# Patient Record
Sex: Female | Born: 1980 | Hispanic: No | Marital: Married | Smoking: Never smoker
Health system: Southern US, Community
[De-identification: ages and names within clinical notes are randomized; demographics above are authoritative.]

## PROBLEM LIST (undated history)

## (undated) DIAGNOSIS — Z789 Other specified health status: Secondary | ICD-10-CM

## (undated) HISTORY — PX: NO PAST SURGERIES: SHX2092

---

## 2014-06-14 LAB — OB RESULTS CONSOLE RUBELLA ANTIBODY, IGM: RUBELLA: IMMUNE

## 2014-06-14 LAB — OB RESULTS CONSOLE HIV ANTIBODY (ROUTINE TESTING): HIV: NONREACTIVE

## 2014-06-14 LAB — OB RESULTS CONSOLE ABO/RH: RH Type: POSITIVE

## 2014-06-14 LAB — OB RESULTS CONSOLE ANTIBODY SCREEN: Antibody Screen: NEGATIVE

## 2014-06-14 LAB — OB RESULTS CONSOLE HEPATITIS B SURFACE ANTIGEN: Hepatitis B Surface Ag: NEGATIVE

## 2014-06-14 LAB — OB RESULTS CONSOLE RPR: RPR: NONREACTIVE

## 2014-06-24 ENCOUNTER — Other Ambulatory Visit (HOSPITAL_COMMUNITY)
Admission: RE | Admit: 2014-06-24 | Discharge: 2014-06-24 | Disposition: A | Discharge status: Home / Self Care | Payer: BC Managed Care – PPO | Source: Ambulatory Visit | Attending: Obstetrics & Gynecology | Admitting: Obstetrics & Gynecology

## 2014-06-24 DIAGNOSIS — Z01419 Encounter for gynecological examination (general) (routine) without abnormal findings: Secondary | ICD-10-CM | POA: Insufficient documentation

## 2014-06-24 DIAGNOSIS — Z113 Encounter for screening for infections with a predominantly sexual mode of transmission: Secondary | ICD-10-CM | POA: Insufficient documentation

## 2014-06-24 DIAGNOSIS — Z1151 Encounter for screening for human papillomavirus (HPV): Secondary | ICD-10-CM | POA: Insufficient documentation

## 2014-06-24 LAB — OB RESULTS CONSOLE GC/CHLAMYDIA
CHLAMYDIA, DNA PROBE: NEGATIVE
GC PROBE AMP, GENITAL: NEGATIVE

## 2014-10-01 NOTE — L&D Delivery Note (Signed)
Delivery Note At 7:35 AM a viable female was delivered via Vaginal, Spontaneous Delivery (Presentation: Right Occiput Anterior).  APGAR: 9, 9; weight 7 lb 15 oz (3600 g).   Placenta status: Intact, Spontaneous.  Cord: 3 vessels with the following complications: None.    Anesthesia: local Episiotomy: None Lacerations: 1st degree Suture Repair: 3.0 vicryl Est. Blood Loss (mL): 350  Mom to postpartum.  Baby to Couplet care / Skin to Skin.  Wanda Peterson, Wanda Peterson, M 12/14/2014, 10:03 AM

## 2014-12-14 ENCOUNTER — Encounter (HOSPITAL_COMMUNITY): Payer: Self-pay | Admitting: *Deleted

## 2014-12-14 ENCOUNTER — Inpatient Hospital Stay (HOSPITAL_COMMUNITY)
Admission: AD | Admit: 2014-12-14 | Discharge: 2014-12-15 | DRG: 775 | Disposition: A | Discharge status: Home / Self Care | Payer: 59 | Source: Ambulatory Visit | Attending: Obstetrics & Gynecology | Admitting: Obstetrics & Gynecology

## 2014-12-14 DIAGNOSIS — O09529 Supervision of elderly multigravida, unspecified trimester: Secondary | ICD-10-CM

## 2014-12-14 DIAGNOSIS — Z3A38 38 weeks gestation of pregnancy: Secondary | ICD-10-CM | POA: Diagnosis present

## 2014-12-14 DIAGNOSIS — Z789 Other specified health status: Secondary | ICD-10-CM

## 2014-12-14 DIAGNOSIS — Z641 Problems related to multiparity: Secondary | ICD-10-CM

## 2014-12-14 HISTORY — DX: Other specified health status: Z78.9

## 2014-12-14 LAB — CBC
HCT: 39.1 % (ref 36.0–46.0)
Hemoglobin: 13 g/dL (ref 12.0–15.0)
MCH: 29 pg (ref 26.0–34.0)
MCHC: 33.2 g/dL (ref 30.0–36.0)
MCV: 87.1 fL (ref 78.0–100.0)
Platelets: 247 10*3/uL (ref 150–400)
RBC: 4.49 MIL/uL (ref 3.87–5.11)
RDW: 14.7 % (ref 11.5–15.5)
WBC: 11.2 10*3/uL — ABNORMAL HIGH (ref 4.0–10.5)

## 2014-12-14 LAB — RPR: RPR Ser Ql: NONREACTIVE

## 2014-12-14 LAB — TYPE AND SCREEN
ABO/RH(D): B POS
ANTIBODY SCREEN: NEGATIVE

## 2014-12-14 LAB — OB RESULTS CONSOLE GBS: STREP GROUP B AG: NEGATIVE

## 2014-12-14 LAB — ABO/RH: ABO/RH(D): B POS

## 2014-12-14 LAB — GROUP B STREP BY PCR: Group B strep by PCR: NEGATIVE

## 2014-12-14 LAB — POCT FERN TEST: POCT FERN TEST: NEGATIVE

## 2014-12-14 MED ORDER — OXYTOCIN 40 UNITS IN LACTATED RINGERS INFUSION - SIMPLE MED
62.5000 mL/h | INTRAVENOUS | Status: DC
  Filled 2014-12-14: qty 1000

## 2014-12-14 MED ORDER — SIMETHICONE 80 MG PO CHEW: 80.0000 mg | CHEWABLE_TABLET | ORAL | Status: DC | PRN

## 2014-12-14 MED ORDER — ONDANSETRON HCL 4 MG/2ML IJ SOLN: 4.0000 mg | INTRAMUSCULAR | Status: DC | PRN

## 2014-12-14 MED ORDER — DIPHENHYDRAMINE HCL 25 MG PO CAPS: 25.0000 mg | ORAL_CAPSULE | Freq: Four times a day (QID) | ORAL | Status: DC | PRN

## 2014-12-14 MED ORDER — SENNOSIDES-DOCUSATE SODIUM 8.6-50 MG PO TABS
2.0000 | ORAL_TABLET | ORAL | Status: DC
  Administered 2014-12-14: 2 via ORAL
  Filled 2014-12-14: qty 2

## 2014-12-14 MED ORDER — IBUPROFEN 600 MG PO TABS
600.0000 mg | ORAL_TABLET | Freq: Four times a day (QID) | ORAL | Status: DC
  Administered 2014-12-14 – 2014-12-15 (×5): 600 mg via ORAL
  Filled 2014-12-14 (×5): qty 1

## 2014-12-14 MED ORDER — LACTATED RINGERS IV SOLN: 500.0000 mL | INTRAVENOUS | Status: DC | PRN

## 2014-12-14 MED ORDER — ZOLPIDEM TARTRATE 5 MG PO TABS: 5.0000 mg | ORAL_TABLET | Freq: Every evening | ORAL | Status: DC | PRN

## 2014-12-14 MED ORDER — WITCH HAZEL-GLYCERIN EX PADS: 1.0000 "application " | MEDICATED_PAD | CUTANEOUS | Status: DC | PRN

## 2014-12-14 MED ORDER — TERBUTALINE SULFATE 1 MG/ML IJ SOLN
0.2500 mg | Freq: Once | INTRAMUSCULAR | Status: DC | PRN
  Filled 2014-12-14: qty 1

## 2014-12-14 MED ORDER — BENZOCAINE-MENTHOL 20-0.5 % EX AERO
1.0000 "application " | INHALATION_SPRAY | CUTANEOUS | Status: DC | PRN
  Administered 2014-12-15: 1 via TOPICAL
  Filled 2014-12-14 (×2): qty 56

## 2014-12-14 MED ORDER — NALBUPHINE HCL 10 MG/ML IJ SOLN
10.0000 mg | INTRAMUSCULAR | Status: DC | PRN
  Administered 2014-12-14 (×2): 10 mg via INTRAVENOUS
  Filled 2014-12-14: qty 1

## 2014-12-14 MED ORDER — OXYTOCIN 40 UNITS IN LACTATED RINGERS INFUSION - SIMPLE MED
1.0000 m[IU]/min | INTRAVENOUS | Status: DC
  Administered 2014-12-14: 1 m[IU]/min via INTRAVENOUS

## 2014-12-14 MED ORDER — DIBUCAINE 1 % RE OINT: 1.0000 "application " | TOPICAL_OINTMENT | RECTAL | Status: DC | PRN

## 2014-12-14 MED ORDER — OXYTOCIN BOLUS FROM INFUSION: 500.0000 mL | INTRAVENOUS | Status: DC

## 2014-12-14 MED ORDER — OXYCODONE-ACETAMINOPHEN 5-325 MG PO TABS: 2.0000 | ORAL_TABLET | ORAL | Status: DC | PRN

## 2014-12-14 MED ORDER — NALBUPHINE HCL 10 MG/ML IJ SOLN
5.0000 mg | INTRAMUSCULAR | Status: DC | PRN
  Filled 2014-12-14: qty 1

## 2014-12-14 MED ORDER — CITRIC ACID-SODIUM CITRATE 334-500 MG/5ML PO SOLN: 30.0000 mL | ORAL | Status: DC | PRN

## 2014-12-14 MED ORDER — PRENATAL MULTIVITAMIN CH
1.0000 | ORAL_TABLET | Freq: Every day | ORAL | Status: DC
  Administered 2014-12-15: 1 via ORAL
  Filled 2014-12-14: qty 1

## 2014-12-14 MED ORDER — LACTATED RINGERS IV SOLN
INTRAVENOUS | Status: DC
  Administered 2014-12-14: 125 mL/h via INTRAVENOUS

## 2014-12-14 MED ORDER — FLEET ENEMA 7-19 GM/118ML RE ENEM: 1.0000 | ENEMA | RECTAL | Status: DC | PRN

## 2014-12-14 MED ORDER — ONDANSETRON HCL 4 MG PO TABS: 4.0000 mg | ORAL_TABLET | ORAL | Status: DC | PRN

## 2014-12-14 MED ORDER — LIDOCAINE HCL (PF) 1 % IJ SOLN
30.0000 mL | INTRAMUSCULAR | Status: DC | PRN
  Administered 2014-12-14: 30 mL via SUBCUTANEOUS
  Filled 2014-12-14: qty 30

## 2014-12-14 MED ORDER — ACETAMINOPHEN 325 MG PO TABS: 650.0000 mg | ORAL_TABLET | ORAL | Status: DC | PRN

## 2014-12-14 MED ORDER — ONDANSETRON HCL 4 MG/2ML IJ SOLN: 4.0000 mg | Freq: Four times a day (QID) | INTRAMUSCULAR | Status: DC | PRN

## 2014-12-14 MED ORDER — OXYCODONE-ACETAMINOPHEN 5-325 MG PO TABS: 1.0000 | ORAL_TABLET | ORAL | Status: DC | PRN

## 2014-12-14 MED ORDER — LANOLIN HYDROUS EX OINT: TOPICAL_OINTMENT | CUTANEOUS | Status: DC | PRN

## 2014-12-14 MED ORDER — MISOPROSTOL 200 MCG PO TABS
ORAL_TABLET | ORAL | Status: AC
  Filled 2014-12-14: qty 5

## 2014-12-14 NOTE — Lactation Note (Signed)
This note was copied from the chart of Wanda Peterson. Lactation Consultation Note  Patient Name: Wanda Linna Darnerzza Madore GMWNU'UToday's Date: 12/14/2014 Reason for consult: Initial assessment of this mom and baby at 15 hours pp. This is mom's sixth child.  She now has 2 daughters and 4 sons, ages from 563 yo up to 36 yo.  Mom says she breastfed her other children for 2-3 months each and has chosen to both breast and formula feed, as she did with her other babies.  Her newborn is asleep on his back, in open crib and no cuing at time of LC visit.  LC reviewed LEAD cautions and encouraged frequent STS and cue feedings at breast. Mom says she knows how to hand express her milk.  LC reviewed supply and demand and nature of mom's rich colostrum.  FOB asks about the difference between formula and mother's milk and LC reviewed immunologic benefits and ease of digestion, with exclusive breastfeeding and breast milk as the ideal food for a newborn.   Mom encouraged to feed baby 8-12 times/24 hours and with feeding cues. LC encouraged review of Baby and Me pp 9, 14 and 20-25 for STS and BF information. LC provided [employer] Resource brochure and reviewed [facility] services and list of community and web site resources.   Maternal Data Formula Feeding for Exclusion: No (mom did not state her plan to also bottle-feed until after delivery) Has patient been taught Hand Expression?: Yes (per RN and mom confirms knowing how to hand express) Does the patient have breastfeeding experience prior to this delivery?: Yes  Feeding Feeding Type: Breast Fed Length of feed: 12 min  LATCH Score/Interventions Latch: Repeated attempts needed to sustain latch, nipple held in mouth throughout feeding, stimulation needed to elicit sucking reflex. Intervention(s): Adjust position;Assist with latch;Breast compression  Audible Swallowing: A few with stimulation Intervention(s): Hand expression  Type of Nipple: Everted at rest and after stimulation  Comfort  (Breast/Nipple): Soft / non-tender     Hold (Positioning): Assistance needed to correctly position infant at breast and maintain latch. Intervention(s): Breastfeeding basics reviewed;Support Pillows;Position options  LATCH Score: 7  Lactation Tools Discussed/Used   STS, cue feedings, hand expression Benefits of breast milk  Consult Status Consult Status: Follow-up Date: 12/15/14 Follow-up type: In-patient    Warrick ParisianBryant, Karlis Cregg [facility] 12/14/2014, 10:55 PM

## 2014-12-14 NOTE — MAU Note (Signed)
BROUGHT  FROM LOBBY  TO RM  7 -  SAYS  NOT SROM   VE 1 CM IN OFFICE -

## 2014-12-14 NOTE — Progress Notes (Signed)
OB PN:  S: Pt feeling painful contractions.  O: BP 140/81 mmHg  Pulse 82  Temp(Src) 98.2 F (36.8 C) (Oral)  Resp 20  Ht 5' 4.96" (1.65 m)  Wt 83 kg (182 lb 15.7 oz)  BMI 30.49 kg/m2  LMP 03/23/2014  Last BP: 129/86  FHT: 125bpm, moderate variablity, + accels, no decels Toco: q3-704min SVE: 9/C/-1  A/P: 34 y.o. [id number] @ 3558w0d who presented in active labor 1. FWB: Cat. I 2. Labor: Pit per protocol due to inadequate contractions Pain: IV pain medication as needed GBS: negative  Myna HidalgoJennifer Vladimir Lenhoff, DO [phone] (pager) [phone] (office)

## 2014-12-14 NOTE — H&P (Signed)
Wanda Peterson is a 34 y.o. female, pt of Dr. Charlotta Newtonzan, [id number] at 38.0 wks based on certain LMP, confirmed by ultrasound with EDD 12/28/2014 presents with regular contractions. SROM (clear fluid) while in MAU. Pregnancy overall uncomplicated. Reports active fetus. Denies VB.  Anatomy scan at 20 wks revealed anterior placenta with normal findings.   Maternal Medical History:  Reason for admission: Rupture of membranes and contractions.   Contractions: Onset was less than 1 hour ago.   Frequency: regular.   Duration is approximately 60 seconds.   Perceived severity is moderate.    Fetal activity: Perceived fetal activity is normal.   Last perceived fetal movement was within the past hour.    Prenatal complications: No bleeding.   Prenatal Complications - Diabetes: none.    OB History    Gravida Para Term Preterm AB TAB SAB Ectopic Multiple Living   6 5 5       5      SVD 01/2003 @ 40 wks, 2-hr labor, female infant, birthwt  6lbs SVD 03/2004 @ 40 wks, 3-hr labor, female infant, birthwt 7 lbs SVD 11/2006 @ 40 wks, 4-hr labor, female infant, birthwt 6 lbs SVD 08/2008 @ 40 wks, 1 1/2 hr labor, female infant, birthwt 7 lbs SVD 01/2012 @ 40 wks, 6-hr labor, female infant, birthwt 6 lbs  All deliveries in [place]   Past Medical History  Diagnosis Date  . Medical history non-contributory   Small umbilical hernia  Past Surgical History  Procedure Laterality Date  . No past surgeries     Family History: family history is not on file.Heart disease and DM in her father (has pacemaker), HTN in her mother and Down Syndrome in her niece. FOB w/ family hx of Thalassemia and Down Syndrome in two nieces. Social History:  reports that she has never smoked. She does not have any smokeless tobacco history on file. She reports that she does not drink alcohol or use illicit drugs.Pt is married to Northwest AirlinesMotaz. [place] moved here 2 1/2 yrs ago. Received Tdap on 09/10/2014. Also received flu  shot.   Prenatal Transfer Tool  Maternal Diabetes: No Genetic Screening: Normal Maternal Ultrasounds/Referrals: Normal Fetal Ultrasounds or other Referrals:  Referred to Materal Fetal Medicine -[facility] Maternal Substance Abuse:  No Significant Maternal Medications:  Meds include: Other: PNV Significant Maternal Lab Results:  Lab values include: Group B Strep negative Other Comments:  Neg GC/CT/pap on 06/24/14, +yeast (treated) and normal TSH on 06/24/14. Neg urine culture on 9/14.  Review of Systems  Constitutional: Negative for fever and chills.    Dilation: 4 Effacement (%): 100 Station: -2 Blood pressure 112/70, pulse 78, temperature 98.2 F (36.8 C), temperature source Oral, resp. rate 18, height 5' 4.96" (1.65 m), weight 182 lb 15.7 oz (83 kg), last menstrual period 03/23/2014. Maternal Exam:  Uterine Assessment: Contraction strength is moderate.  Contraction duration is 90 seconds. Contraction frequency is irregular.   Abdomen: Patient reports no abdominal tenderness. Fundal height is 38 cm.   Estimated fetal weight is 7 lbs.   Fetal presentation: vertex  Introitus: Normal vulva. Normal vagina.  Ferning test: positive.  Nitrazine test: not done. Amniotic fluid character: clear.  Pelvis: adequate for delivery.   Cervix: Cervix evaluated by digital exam.     Fetal Exam Fetal Monitor Review: Mode: fetoscope.   Baseline rate: 135.  Variability: moderate (6-25 bpm).   Pattern: accelerations present and no decelerations.    Fetal State Assessment: Category I - tracings  are normal.     Physical Exam  Nursing note and vitals reviewed. Constitutional: She is oriented to person, place, and time. She appears well-developed and well-nourished.  HENT:  Head: Normocephalic and atraumatic.  Neck: Normal range of motion.  Cardiovascular: Normal rate, regular rhythm and normal heart sounds.   Respiratory: Effort normal and breath sounds normal.  GI: Soft. She exhibits no  distension. There is no rebound and no guarding.  Genitourinary: Vagina normal.  Musculoskeletal: Normal range of motion. She exhibits no edema.  Neurological: She is alert and oriented to person, place, and time. She has normal reflexes.  Skin: Skin is warm and dry.  Psychiatric: She has a normal mood and affect. Her behavior is normal.    Prenatal labs: ABO, Rh: B/Positive/-- (09/14 0000) Antibody: Negative (09/14 0000) Rubella: Immune (09/14 0000) RPR: Nonreactive (09/14 0000)  HBsAg: Negative (09/14 0000)  HIV: Non-reactive (09/14 0000)  GBS: Unknown (neg by pt report)  Assessment: IUP at 38.0 wks Early labor SROM (0045 in MAU) GBS unknown Language barrier History of rapid labors Grand multip AMA  Plan: Admit to BS Routine orders GBS by PCR IV pain med/epidural as desired Anticipate progress and SVD   Sherre Scarlet 12/14/2014, 1:30 AM

## 2014-12-15 LAB — CBC
HCT: 35.6 % — ABNORMAL LOW (ref 36.0–46.0)
Hemoglobin: 11.5 g/dL — ABNORMAL LOW (ref 12.0–15.0)
MCH: 28.3 pg (ref 26.0–34.0)
MCHC: 32.3 g/dL (ref 30.0–36.0)
MCV: 87.5 fL (ref 78.0–100.0)
Platelets: 175 10*3/uL (ref 150–400)
RBC: 4.07 MIL/uL (ref 3.87–5.11)
RDW: 14.7 % (ref 11.5–15.5)
WBC: 13.4 10*3/uL — ABNORMAL HIGH (ref 4.0–10.5)

## 2014-12-15 MED ORDER — IBUPROFEN 600 MG PO TABS: 600.0000 mg | ORAL_TABLET | Freq: Four times a day (QID) | ORAL | Status: DC | PRN

## 2014-12-15 NOTE — Progress Notes (Signed)
Postpartum Note Day # 1  S:  Patient resting comfortable in bed.  Pain controlled.  Tolerating general diet. + BM.  Lochia moderate.  Ambulating without difficulty.  She denies n/v/f/c, SOB, or CP.  Pt plans on breastfeeding.  O: Temp:  [97.6 F (36.4 C)-98.3 F (36.8 C)] 98 F (36.7 C) (03/16 0552) Pulse Rate:  [66-91] 66 (03/16 0552) Resp:  [16-18] 16 (03/16 0552) BP: (102-134)/(34-106) 103/67 mmHg (03/16 0552) SpO2:  [99 %] 99 % (03/15 2315) Gen: A&Ox3, NAD CV: RRR, no MRG Resp: CTAB Abdomen: soft, NT, ND Uterus: firm, non-tender, below umbilicus Ext: No edema, no calf tenderness bilaterally  Labs:  Recent Labs  12/14/14 0130 12/15/14 0545  HGB 13.0 11.5*    A/P: Pt is a 34 y.o. [id number] s/p NSVD, PPD#1  - Pain well controlled -GU: voiding freely -GI: Tolerating general diet -Activity: encouraged sitting up to chair and ambulation as tolerated -Prophylaxis: early ambulation -Labs: stable as above -Baby boy circ to be done this am  DISPO: Meeting postpartum milestones appropriately, plan for discharge home later today.  Myna HidalgoJennifer Kelaiah Escalona, DO [phone] (pager) [phone] (office)

## 2014-12-15 NOTE — Discharge Instructions (Signed)
Vaginal Delivery, Care After Refer to this sheet in the next few weeks. These discharge instructions provide you with information on caring for yourself after delivery. Your caregiver may also give you specific instructions. Your treatment has been planned according to the most current medical practices available, but problems sometimes occur. Call your caregiver if you have any problems or questions after you go home. HOME CARE INSTRUCTIONS  Take over-the-counter or prescription medicines only as directed by your caregiver or pharmacist.  You may take either Motrin (Ibuprofen) or Tylenol as needed for pain.  Do not drink alcohol, especially if you are breastfeeding or taking medicine to relieve pain.  Do not chew or smoke tobacco.  Do not use illegal drugs.  Continue to use good perineal care. Good perineal care includes:  Wiping your perineum from front to back.  Keeping your perineum clean.  Do not use tampons or douche until your caregiver says it is okay.  Shower, wash your hair, and take tub baths as directed by your caregiver.  Wear a well-fitting bra that provides breast support.  Eat healthy foods.  Drink enough fluids to keep your urine clear or pale yellow.  Eat high-fiber foods such as whole grain cereals and breads, brown rice, beans, and fresh fruits and vegetables every day. These foods may help prevent or relieve constipation.  Follow your caregiver's recommendations regarding resumption of activities such as climbing stairs, driving, lifting, exercising, or traveling.  Talk to your caregiver about resuming sexual activities. Resumption of sexual activities is dependent upon your risk of infection, your rate of healing, and your comfort and desire to resume sexual activity.  Try to have someone help you with your household activities and your newborn for at least a few days after you leave the hospital.  Rest as much as possible. Try to rest or take a nap when your  newborn is sleeping.  Increase your activities gradually.  Keep all of your scheduled postpartum appointments. It is very important to keep your scheduled follow-up appointments. At these appointments, your caregiver will be checking to make sure that you are healing physically and emotionally. SEEK MEDICAL CARE IF:   You are passing large clots from your vagina. Save any clots to show your caregiver.  You have a foul smelling discharge from your vagina.  You have trouble urinating.  You are urinating frequently.  You have pain when you urinate.  You have a change in your bowel movements.  You have increasing redness, pain, or swelling near your vaginal incision (episiotomy) or vaginal tear.  You have pus draining from your episiotomy or vaginal tear.  Your episiotomy or vaginal tear is separating.  You have painful, hard, or reddened breasts.  You have a severe headache.  You have blurred vision or see spots.  You feel sad or depressed.  You have thoughts of hurting yourself or your newborn.  You have questions about your care, the care of your newborn, or medicines.  You are dizzy or light-headed.  You have a rash.  You have nausea or vomiting.  You were breastfeeding and have not had a menstrual period within 12 weeks after you stopped breastfeeding.  You are not breastfeeding and have not had a menstrual period by the 12th week after delivery.  You have a fever. SEEK IMMEDIATE MEDICAL CARE IF:   You have persistent pain.  You have chest pain.  You have shortness of breath.  You faint.  You have leg pain.  You  have stomach pain.  Your vaginal bleeding saturates two or more sanitary pads in 1 hour. MAKE SURE YOU:   Understand these instructions.  Will watch your condition.  Will get help right away if you are not doing well or get worse. Document Released: 09/14/2000 Document Revised: 02/01/2014 Document Reviewed: 05/14/2012 [facility] Patient  Information 2015 [place], [place]. This information is not intended to replace advice given to you by your health care provider. Make sure you discuss any questions you have with your health care provider.

## 2014-12-15 NOTE — Lactation Note (Signed)
This note was copied from the chart of Wanda Peterson. Lactation Consultation Note  Offered interpreter and mother stated she understood english and did not need translation. Mother denies problems or soreness w/ breastfeeding. Mother states she gave formula when she was really tired.  Reviewed supply and demand. Reviewed engorgement care and Baby & Me booklet pg 24 to monitor voids/stools. Encouraged her to call if she needs further assistance.  Patient Name: Wanda Linna Darnerzza Rawl WUJWJ'XToday's Date: 12/15/2014 Reason for consult: Follow-up assessment   Maternal Data    Feeding    LATCH Score/Interventions                      Lactation Tools Discussed/Used     Consult Status Consult Status: Complete    Hardie PulleyBerkelhammer, Leviathan Macera Boschen 12/15/2014, 11:45 AM

## 2014-12-17 NOTE — Discharge Summary (Signed)
Obstetric Discharge Summary Reason for Admission: onset of labor Prenatal Procedures: none Intrapartum Procedures: spontaneous vaginal delivery Postpartum Procedures: none Complications-Operative and Postpartum: 1st degree perineal laceration  Date of Admission: 12/14/14 Date of Discharge: 12/15/14 HEMOGLOBIN  Date Value Ref Range Status  12/15/2014 11.5* 12.0 - 15.0 g/dL Final   HCT  Date Value Ref Range Status  12/15/2014 35.6* 36.0 - 46.0 % Final   Hospital Course: 34 y.o. female, pt of Dr. Charlotta Newtonzan, [id number] at 38.0 wks based on certain LMP, confirmed by ultrasound with EDD 12/28/2014 presents with regular contractions. SROM (clear fluid) while in MAU. On arrival, the patient was noted to be 4cm dilated.  Due to slow progression, Pitocin was used for augmentation.  She progressed to complete.  NSVD performed by Dr. Charlotta Newtonzan without complications.  1st degree tear repaired in the usual fashion.  See delivery note for further information.  Her postpartum course was uncomplicated and she was discharged home in stable condition on POD#1.  Physical Exam:  Gen: A&Ox3, NAD CV: RRR, no MRG Resp: CTAB Abdomen: soft, NT, ND Uterus: firm, non-tender, below umbilicus Ext: No edema, no calf tenderness bilaterally  Discharge Diagnoses: Term Pregnancy-delivered  Discharge Information: Date: 12/17/2014 Activity: pelvic rest Diet: routine Medications: PNV and Ibuprofen Condition: stable Instructions: refer to practice specific booklet Discharge to: home Follow-up Information    Follow up with Myna HidalgoZAN, Raedyn Wenke, M, DO In 6 weeks.   Specialty:  Obstetrics and Gynecology   Contact information:   [address] [place] [id number] [phone]       Newborn Data: Live born female  Birth Weight: 7 lb 15 oz (3600 g) APGAR: 9, 9  Home with mother.  Myna HidalgoZAN, Christipher Rieger, M 12/17/2014, 8:13 AM

## 2016-01-19 DIAGNOSIS — M79642 Pain in left hand: Secondary | ICD-10-CM | POA: Diagnosis not present

## 2016-11-29 DIAGNOSIS — Z0000 Encounter for general adult medical examination without abnormal findings: Secondary | ICD-10-CM | POA: Diagnosis not present

## 2016-11-29 DIAGNOSIS — R0609 Other forms of dyspnea: Secondary | ICD-10-CM | POA: Diagnosis not present

## 2016-11-29 DIAGNOSIS — M791 Myalgia: Secondary | ICD-10-CM | POA: Diagnosis not present

## 2017-03-11 ENCOUNTER — Emergency Department (HOSPITAL_BASED_OUTPATIENT_CLINIC_OR_DEPARTMENT_OTHER)
Admission: EM | Admit: 2017-03-11 | Discharge: 2017-03-12 | Disposition: A | Discharge status: Home / Self Care | Payer: BLUE CROSS/BLUE SHIELD | Source: Home / Self Care | Attending: Emergency Medicine | Admitting: Emergency Medicine

## 2017-03-11 ENCOUNTER — Encounter (HOSPITAL_BASED_OUTPATIENT_CLINIC_OR_DEPARTMENT_OTHER): Payer: Self-pay

## 2017-03-11 DIAGNOSIS — N83201 Unspecified ovarian cyst, right side: Secondary | ICD-10-CM | POA: Diagnosis not present

## 2017-03-11 DIAGNOSIS — N83202 Unspecified ovarian cyst, left side: Secondary | ICD-10-CM | POA: Diagnosis not present

## 2017-03-11 DIAGNOSIS — N390 Urinary tract infection, site not specified: Secondary | ICD-10-CM

## 2017-03-11 DIAGNOSIS — R1084 Generalized abdominal pain: Secondary | ICD-10-CM | POA: Diagnosis not present

## 2017-03-11 DIAGNOSIS — N3000 Acute cystitis without hematuria: Secondary | ICD-10-CM | POA: Diagnosis not present

## 2017-03-11 DIAGNOSIS — Z79899 Other long term (current) drug therapy: Secondary | ICD-10-CM | POA: Diagnosis not present

## 2017-03-11 LAB — URINALYSIS, ROUTINE W REFLEX MICROSCOPIC
Bilirubin Urine: NEGATIVE
GLUCOSE, UA: NEGATIVE mg/dL
Ketones, ur: 15 mg/dL — AB
NITRITE: POSITIVE — AB
PH: 6.5 (ref 5.0–8.0)
Protein, ur: >300 mg/dL — AB
Specific Gravity, Urine: 1.021 (ref 1.005–1.030)

## 2017-03-11 LAB — URINALYSIS, MICROSCOPIC (REFLEX)

## 2017-03-11 LAB — PREGNANCY, URINE: Preg Test, Ur: NEGATIVE

## 2017-03-11 NOTE — ED Provider Notes (Signed)
[facility] Provider Note   CSN: [id number] Arrival date & time: 03/11/17  2207  By signing my name below, I, Diona Browner, attest that this documentation has been prepared under the direction and in the presence of Dornell Grasmick, PA-C. Electronically Signed: Diona Browner, ED Scribe. 03/11/17. 12:10 AM.  History   Chief Complaint Chief Complaint  Patient presents with  . Flank Pain    HPI Wanda Peterson is a 36 y.o. female who presents to the Emergency Department complaining of Dysuria and right-sided flank pain for the past 5 days. Associated sx include lower abdominal pain, urinary frequency, and hematuria. She has tried OTC urinary pain relief with mild to no relief. No sick contacts. No prior history of these symptoms. Pt denies fever, vaginal bleeding, LOC, nausea, vomiting, diarrhea, and appetite changes.  The history is provided by the patient. No language interpreter was used.   Past Medical History:  Diagnosis Date  . Medical history non-contributory     Patient Active Problem List   Diagnosis Date Noted  . Normal labor 12/14/2014  . Language barrier 12/14/2014  . Grand multiparity 12/14/2014  . Elderly multigravida 12/14/2014    Past Surgical History:  Procedure Laterality Date  . NO PAST SURGERIES      OB History    Gravida Para Term Preterm AB Living   6 6 6     1    SAB TAB Ectopic Multiple Live Births         0 1     Home Medications    Prior to Admission medications   Medication Sig Start Date End Date Taking? Authorizing Provider  phenazopyridine (PYRIDIUM) 97 MG tablet Take 97 mg by mouth 3 (three) times daily as needed for pain.   Yes [provider]  nitrofurantoin, macrocrystal-monohydrate, (MACROBID) 100 MG capsule Take 1 capsule (100 mg total) by mouth 2 (two) times daily. 03/12/17   Dietrich Pates, PA-C   Family History No family history on file.  Social History Social History  Substance Use Topics  . Smoking status:  Never Smoker  . Smokeless tobacco: Never Used  . Alcohol use No   Allergies   Patient has no known allergies.  Review of Systems Review of Systems  Constitutional: Negative for appetite change.  Gastrointestinal: Positive for abdominal pain. Negative for diarrhea, nausea and vomiting.  Genitourinary: Positive for dysuria, flank pain, frequency and hematuria. Negative for vaginal bleeding and vaginal discharge.  Musculoskeletal: Positive for back pain.  Neurological: Negative for syncope.  All other systems reviewed and are negative.  Physical Exam Updated Vital Signs BP 110/64 (BP Location: Left Arm)   Pulse 74   Temp 99.1 F (37.3 C) (Oral)   Resp 18   Wt 77.1 kg (170 lb)   LMP 02/18/2017   SpO2 100%   BMI 28.32 kg/m   Physical Exam  Constitutional: She appears well-developed and well-nourished. No distress.  HENT:  Head: Normocephalic and atraumatic.  Eyes: Conjunctivae and EOM are normal. No scleral icterus.  Neck: Normal range of motion.  Cardiovascular: Normal rate, regular rhythm and normal heart sounds.   Pulmonary/Chest: Effort normal and breath sounds normal. No respiratory distress.  Abdominal: Soft. Bowel sounds are normal. She exhibits no distension. There is tenderness. There is no rebound and no guarding.  Right CVA tenderness. Suprapubic tenderness.  Neurological: She is alert.  Skin: No rash noted. She is not diaphoretic.  Psychiatric: She has a normal mood and affect.  Nursing note and vitals  reviewed.  ED Treatments / Results  DIAGNOSTIC STUDIES: Oxygen Saturation is 100% on RA, normal by my interpretation.   COORDINATION OF CARE: 12:10 AM-Discussed next steps with pt which includes taking antibiotics. If pain worsens or changes she is to return to the ED for reevaluation. Pt verbalized understanding and is agreeable with the plan.   Labs (all labs ordered are listed, but only abnormal results are displayed) Labs Reviewed  URINALYSIS, ROUTINE  W REFLEX MICROSCOPIC - Abnormal; Notable for the following:       Result Value   Color, Urine ORANGE (*)    APPearance CLOUDY (*)    Hgb urine dipstick MODERATE (*)    Ketones, ur 15 (*)    Protein, ur >300 (*)    Nitrite POSITIVE (*)    Leukocytes, UA LARGE (*)    All other components within normal limits  URINALYSIS, MICROSCOPIC (REFLEX) - Abnormal; Notable for the following:    Bacteria, UA FEW (*)    Squamous Epithelial / LPF 6-30 (*)    All other components within normal limits  PREGNANCY, URINE    EKG  EKG Interpretation None      Radiology No results found.  Procedures Procedures (including critical care time)  Medications Ordered in ED Medications - No data to display  Initial Impression / Assessment and Plan / ED Course  I have reviewed the triage vital signs and the nursing notes.  Pertinent labs & imaging results that were available during my care of the patient were reviewed by me and considered in my medical decision making (see chart for details).     Patient presents to the ED for complaints of dysuria and right-sided flank pain. She reports some improvement in symptoms with over-the-counter Pyridium. She denies any fever, history of UTI or kidney disease, kidney stones or vaginal complaints at this time. UA positive for UTI with nitrites, leukocytes and bacteria. Urine pregnancy negative. Patient is afebrile at this time with no history of fever. We will treat with Macrobid for UTI as patient states that she can only take medication twice a day due to fasting during the day. I advised her to follow up with her PCP and to continue taking the Pyridium as needed for the pain. Advised her to finish her entire course of antibiotics. Patient appears stable for discharge at this time with no other complaints. Strict return precautions given.  Final Clinical Impressions(s) / ED Diagnoses   Final diagnoses:  Lower urinary tract infectious disease    New  Prescriptions Discharge Medication List as of 03/12/2017 12:13 AM    START taking these medications   Details  nitrofurantoin, macrocrystal-monohydrate, (MACROBID) 100 MG capsule Take 1 capsule (100 mg total) by mouth 2 (two) times daily., Starting Tue 03/12/2017, Print       I personally performed the services described in this documentation, which was scribed in my presence. The recorded information has been reviewed and is accurate.     Dietrich PatesKhatri, Raeford Brandenburg, PA-C 03/12/17 0047    Gilda CreasePollina, Christopher J, MD 03/12/17 [id number]

## 2017-03-11 NOTE — ED Triage Notes (Signed)
C/o right flank pain, urinary freq, dysuria x 4 days-NAD-steady gait

## 2017-03-12 ENCOUNTER — Emergency Department (HOSPITAL_BASED_OUTPATIENT_CLINIC_OR_DEPARTMENT_OTHER): Payer: BLUE CROSS/BLUE SHIELD

## 2017-03-12 ENCOUNTER — Encounter (HOSPITAL_BASED_OUTPATIENT_CLINIC_OR_DEPARTMENT_OTHER): Payer: Self-pay | Admitting: *Deleted

## 2017-03-12 ENCOUNTER — Emergency Department (HOSPITAL_BASED_OUTPATIENT_CLINIC_OR_DEPARTMENT_OTHER)
Admission: EM | Admit: 2017-03-12 | Discharge: 2017-03-12 | Disposition: A | Discharge status: Home / Self Care | Payer: BLUE CROSS/BLUE SHIELD | Attending: Emergency Medicine | Admitting: Emergency Medicine

## 2017-03-12 DIAGNOSIS — Z79899 Other long term (current) drug therapy: Secondary | ICD-10-CM | POA: Insufficient documentation

## 2017-03-12 DIAGNOSIS — N3000 Acute cystitis without hematuria: Secondary | ICD-10-CM | POA: Diagnosis not present

## 2017-03-12 DIAGNOSIS — N83202 Unspecified ovarian cyst, left side: Secondary | ICD-10-CM | POA: Diagnosis not present

## 2017-03-12 DIAGNOSIS — N83201 Unspecified ovarian cyst, right side: Secondary | ICD-10-CM | POA: Diagnosis not present

## 2017-03-12 LAB — CBC WITH DIFFERENTIAL/PLATELET
Basophils Absolute: 0 10*3/uL (ref 0.0–0.1)
Basophils Relative: 0 %
EOS ABS: 0.5 10*3/uL (ref 0.0–0.7)
EOS PCT: 6 %
HCT: 36.8 % (ref 36.0–46.0)
HEMOGLOBIN: 12.2 g/dL (ref 12.0–15.0)
LYMPHS ABS: 2.4 10*3/uL (ref 0.7–4.0)
LYMPHS PCT: 31 %
MCH: 28 pg (ref 26.0–34.0)
MCHC: 33.2 g/dL (ref 30.0–36.0)
MCV: 84.6 fL (ref 78.0–100.0)
MONOS PCT: 8 %
Monocytes Absolute: 0.7 10*3/uL (ref 0.1–1.0)
Neutro Abs: 4.2 10*3/uL (ref 1.7–7.7)
Neutrophils Relative %: 55 %
Platelets: 336 10*3/uL (ref 150–400)
RBC: 4.35 MIL/uL (ref 3.87–5.11)
RDW: 12.6 % (ref 11.5–15.5)
WBC: 7.8 10*3/uL (ref 4.0–10.5)

## 2017-03-12 LAB — BASIC METABOLIC PANEL
Anion gap: 7 (ref 5–15)
BUN: 17 mg/dL (ref 6–20)
CHLORIDE: 105 mmol/L (ref 101–111)
CO2: 24 mmol/L (ref 22–32)
CREATININE: 0.53 mg/dL (ref 0.44–1.00)
Calcium: 9 mg/dL (ref 8.9–10.3)
GFR calc Af Amer: >60 mL/min (ref >60)
GFR calc non Af Amer: >60 mL/min (ref >60)
Glucose, Bld: 104 mg/dL — ABNORMAL HIGH (ref 65–99)
POTASSIUM: 3.9 mmol/L (ref 3.5–5.1)
SODIUM: 136 mmol/L (ref 135–145)

## 2017-03-12 LAB — URINALYSIS, ROUTINE W REFLEX MICROSCOPIC
BILIRUBIN URINE: NEGATIVE
GLUCOSE, UA: NEGATIVE mg/dL
Ketones, ur: NEGATIVE mg/dL
Nitrite: POSITIVE — AB
Protein, ur: 30 mg/dL — AB
SPECIFIC GRAVITY, URINE: 1.026 (ref 1.005–1.030)
pH: 7 (ref 5.0–8.0)

## 2017-03-12 LAB — URINALYSIS, MICROSCOPIC (REFLEX)

## 2017-03-12 MED ORDER — MORPHINE SULFATE (PF) 4 MG/ML IV SOLN
4.0000 mg | Freq: Once | INTRAVENOUS | Status: AC
Start: 2017-03-12 — End: 2017-03-12
  Administered 2017-03-12: 4 mg via INTRAVENOUS
  Filled 2017-03-12: qty 1

## 2017-03-12 MED ORDER — DEXTROSE 5 % IV SOLN
2.0000 g | Freq: Once | INTRAVENOUS | Status: AC
  Administered 2017-03-12: 2 g via INTRAVENOUS
  Filled 2017-03-12: qty 2

## 2017-03-12 MED ORDER — ONDANSETRON HCL 4 MG/2ML IJ SOLN
4.0000 mg | Freq: Once | INTRAMUSCULAR | Status: AC
  Administered 2017-03-12: 4 mg via INTRAVENOUS
  Filled 2017-03-12: qty 2

## 2017-03-12 MED ORDER — NITROFURANTOIN MONOHYD MACRO 100 MG PO CAPS: 100.0000 mg | ORAL_CAPSULE | Freq: Two times a day (BID) | ORAL | 0 refills | Status: DC

## 2017-03-12 MED ORDER — SODIUM CHLORIDE 0.9 % IV BOLUS (SEPSIS)
1000.0000 mL | Freq: Once | INTRAVENOUS | Status: AC
  Administered 2017-03-12: 1000 mL via INTRAVENOUS

## 2017-03-12 MED ORDER — ONDANSETRON 4 MG PO TBDP: 4.0000 mg | ORAL_TABLET | Freq: Three times a day (TID) | ORAL | 0 refills | Status: DC | PRN

## 2017-03-12 MED ORDER — IOPAMIDOL (ISOVUE-300) INJECTION 61%
100.0000 mL | Freq: Once | INTRAVENOUS | Status: AC | PRN
  Administered 2017-03-12: 100 mL via INTRAVENOUS

## 2017-03-12 MED ORDER — HYDROCODONE-ACETAMINOPHEN 5-325 MG PO TABS: 1.0000 | ORAL_TABLET | ORAL | 0 refills | Status: DC | PRN

## 2017-03-12 MED FILL — HYDROCODON-APAP 5-325: 5-325 | 2 days supply | Qty: 10 | Fill #0

## 2017-03-12 MED FILL — NITROFURANTOIN MONO-MCR 100: 100 | 5 days supply | Qty: 10 | Fill #0

## 2017-03-12 MED FILL — ONDANSETRON ODT 4 MG TABLET: 4 | 4 days supply | Qty: 10 | Fill #0

## 2017-03-12 NOTE — Discharge Instructions (Signed)
Take antibiotics twice daily for 5 days. Continue Pyridium as needed for pain. Follow-up with PCP for further evaluation. Return to ED for worsening pain, discharge, bleeding, fever, increased vomiting.

## 2017-03-12 NOTE — ED Provider Notes (Signed)
[facility] Provider [facility]   CSN: [id number] Arrival date & time: 03/12/17  7829     History   Chief Complaint Chief Complaint  Patient presents with  . Flank Pain    HPI Wanda Peterson is a 36 y.o. female.  Pt presents to the ED today with right sided flank pain and urinary sx.  Pt was here last night and was diagnosed with an UTI.  She did not get any meds prior to d/c.  She said she has gotten worse and she is very uncomfortable.  She denies any fever.      Past Medical History:  Diagnosis Date  . Medical history non-contributory     Patient Active Problem List   Diagnosis Date Noted  . Normal labor 12/14/2014  . Language barrier 12/14/2014  . Grand multiparity 12/14/2014  . Elderly multigravida 12/14/2014    Past Surgical History:  Procedure Laterality Date  . NO PAST SURGERIES      OB History    Gravida Para Term Preterm AB Living   6 6 6     1    SAB TAB Ectopic Multiple Live Births         0 1       Home Medications    Prior to Admission medications   Medication Sig Start Date End Date Taking? Authorizing Provider  HYDROcodone-acetaminophen (NORCO/VICODIN) 5-325 MG tablet Take 1 tablet by mouth every 4 (four) hours as needed. 03/12/17   Jacalyn Lefevre, MD  nitrofurantoin, macrocrystal-monohydrate, (MACROBID) 100 MG capsule Take 1 capsule (100 mg total) by mouth 2 (two) times daily. 03/12/17   Khatri, Hina, PA-C  ondansetron (ZOFRAN ODT) 4 MG disintegrating tablet Take 1 tablet (4 mg total) by mouth every 8 (eight) hours as needed. 03/12/17   Jacalyn Lefevre, MD  phenazopyridine (PYRIDIUM) 97 MG tablet Take 97 mg by mouth 3 (three) times daily as needed for pain.    [provider]    Family History History reviewed. No pertinent family history.  Social History Social History  Substance Use Topics  . Smoking status: Never Smoker  . Smokeless tobacco: Never Used  . Alcohol use No     Allergies   Patient has no known  allergies.   Review of Systems Review of Systems  Gastrointestinal: Positive for nausea.  Genitourinary: Positive for dysuria, flank pain and frequency.  All other systems reviewed and are negative.    Physical Exam Updated Vital Signs BP 131/88 (BP Location: Right Arm)   Pulse 88   Temp 98.5 F (36.9 C) (Oral)   Resp 20   Ht 5\' 6"  (1.676 m)   Wt 77.1 kg (170 lb)   LMP 02/18/2017   SpO2 100%   BMI 27.44 kg/m   Physical Exam  Constitutional: She is oriented to person, place, and time. She appears well-developed and well-nourished. She appears distressed.  HENT:  Head: Normocephalic and atraumatic.  Right Ear: External ear normal.  Left Ear: External ear normal.  Nose: Nose normal.  Mouth/Throat: Oropharynx is clear and moist.  Eyes: Conjunctivae and EOM are normal. Pupils are equal, round, and reactive to light.  Neck: Normal range of motion. Neck supple.  Cardiovascular: Normal rate, regular rhythm, normal heart sounds and intact distal pulses.   Pulmonary/Chest: Effort normal and breath sounds normal.  Abdominal: Soft. Bowel sounds are normal.  Musculoskeletal: Normal range of motion.       Arms: Neurological: She is alert and oriented to person, place, and time.  Skin: Skin is warm. She is not diaphoretic.  Psychiatric: She has a normal mood and affect. Her behavior is normal. Judgment and thought content normal.  Nursing note and vitals reviewed.    ED Treatments / Results  Labs (all labs ordered are listed, but only abnormal results are displayed) Labs Reviewed  BASIC METABOLIC PANEL - Abnormal; Notable for the following:       Result Value   Glucose, Bld 104 (*)    All other components within normal limits  URINALYSIS, ROUTINE W REFLEX MICROSCOPIC - Abnormal; Notable for the following:    Color, Urine ORANGE (*)    APPearance CLOUDY (*)    Hgb urine dipstick MODERATE (*)    Protein, ur 30 (*)    Nitrite POSITIVE (*)    Leukocytes, UA MODERATE (*)     All other components within normal limits  URINALYSIS, MICROSCOPIC (REFLEX) - Abnormal; Notable for the following:    Bacteria, UA RARE (*)    Squamous Epithelial / LPF 0-5 (*)    All other components within normal limits  CBC WITH DIFFERENTIAL/PLATELET    EKG  EKG Interpretation None       Radiology Ct Abdomen Pelvis W Contrast  Result Date: 03/12/2017 CLINICAL DATA:  Right-sided flank pain EXAM: CT ABDOMEN AND PELVIS WITH CONTRAST TECHNIQUE: Multidetector CT imaging of the abdomen and pelvis was performed using the standard protocol following bolus administration of intravenous contrast. CONTRAST:  100mL ISOVUE-300 IOPAMIDOL (ISOVUE-300) INJECTION 61% COMPARISON:  None. FINDINGS: Lower chest: No acute abnormality. Hepatobiliary: There is a 1 cm hyper dense lesion identified within the medial segment of the left lobe of the liverbest seen on image number 24 of series 2. This likely represents a hemangioma. The gallbladder is decompressed but within normal limits. Pancreas: Unremarkable. No pancreatic ductal dilatation or surrounding inflammatory changes. Spleen: Normal in size without focal abnormality. Adrenals/Urinary Tract: The adrenal glands are within normal limits bilaterally. No renal calculi or obstructive changes are seen. Mild thickening of the collecting system and right ureter are seen which may represent a degree of inflammatory change. The bladder is well distended. Stomach/Bowel: The appendix is well visualized without significant dilatation or inflammatory change. No obstructive or inflammatory changes in the bowel are noted. Vascular/Lymphatic: No significant vascular findings are present. No enlarged abdominal or pelvic lymph nodes. Reproductive: An IUD is noted in place. Ovarian cysts are noted bilaterally. The largest of these on the left measures 2.6 cm. The largest on the right measures 1.9 cm. Other: No free fluid is noted. Small fat containing umbilical hernia is noted.  Musculoskeletal: No acute or significant osseous findings. IMPRESSION: Inflammatory changes of the right collecting system and ureter are noted. No obstructing stone is seen. These changes could be related to recently passed stone. No findings to suggest pyelonephritis are noted at this time. Hyperdensity within the liver likely representing a hemangioma. Bilateral small ovarian cysts. Electronically Signed   By: Alcide CleverMark  Lukens M.D.   On: 03/12/2017 08:00    Procedures Procedures (including critical care time)  Medications Ordered in ED Medications  sodium chloride 0.9 % bolus 1,000 mL (1,000 mLs Intravenous New Bag/Given 03/12/17 0726)  ondansetron (ZOFRAN) injection 4 mg (4 mg Intravenous Given 03/12/17 0726)  morphine 4 MG/ML injection 4 mg (4 mg Intravenous Given 03/12/17 0726)  cefTRIAXone (ROCEPHIN) 2 g in dextrose 5 % 50 mL IVPB (0 g Intravenous Stopped 03/12/17 0828)  iopamidol (ISOVUE-300) 61 % injection 100 mL (100 mLs Intravenous Contrast Given  03/12/17 0740)     Initial Impression / Assessment and Plan / ED Course  I have reviewed the triage vital signs and the nursing notes.  Pertinent labs & imaging results that were available during my care of the patient were reviewed by me and considered in my medical decision making (see chart for details).   Pt is feeling much better after IV abx, pain meds, and IVFs.  She has been fasting for Ramadan, but I encouraged her to drink fluids during the day for medical reasons.  She thinks that will be ok since it is for a medical purpose.  She knows to return if worse and to f/u with pcp.    Final Clinical Impressions(s) / ED Diagnoses   Final diagnoses:  Acute cystitis without hematuria    New Prescriptions New Prescriptions   HYDROCODONE-ACETAMINOPHEN (NORCO/VICODIN) 5-325 MG TABLET    Take 1 tablet by mouth every 4 (four) hours as needed.   ONDANSETRON (ZOFRAN ODT) 4 MG DISINTEGRATING TABLET    Take 1 tablet (4 mg total) by mouth every 8  (eight) hours as needed.     Jacalyn Lefevre, MD 03/12/17 [phone]

## 2017-03-12 NOTE — Discharge Instructions (Signed)
Start taking oral antibiotics tomorrow.

## 2017-03-12 NOTE — ED Triage Notes (Signed)
Returns after being d/c'd at 0027 with UTI dx. 1st dose meds not given prior to d/c. Rx not taken to pharmacy. Returns for 10/10 R flank pain "worse than before". No relief with OTC AZO. Husband with pt.   Alert, crying, restless, calm, interactive, resps e/u, no dyspnea noted, skin W&D.

## 2017-03-12 NOTE — ED Notes (Signed)
Patient transported to CT 

## 2017-07-02 ENCOUNTER — Other Ambulatory Visit: Payer: Self-pay | Admitting: Family Medicine

## 2017-07-02 ENCOUNTER — Ambulatory Visit
Admission: RE | Admit: 2017-07-02 | Discharge: 2017-07-02 | Disposition: A | Discharge status: Home / Self Care | Payer: BLUE CROSS/BLUE SHIELD | Source: Ambulatory Visit | Attending: Family Medicine | Admitting: Family Medicine

## 2017-07-02 DIAGNOSIS — R0602 Shortness of breath: Secondary | ICD-10-CM | POA: Diagnosis not present

## 2017-07-02 DIAGNOSIS — R0609 Other forms of dyspnea: Secondary | ICD-10-CM | POA: Diagnosis not present

## 2017-07-02 DIAGNOSIS — R0600 Dyspnea, unspecified: Secondary | ICD-10-CM

## 2017-07-03 ENCOUNTER — Telehealth: Payer: Self-pay | Admitting: Cardiology

## 2017-07-03 NOTE — Telephone Encounter (Signed)
Received records from [facility] for appointment on 08/27/17 with Dr Jens Som.  Records put with Dr Ludwig Clarks schedule for 08/27/17. lp

## 2017-08-01 ENCOUNTER — Ambulatory Visit: Payer: BLUE CROSS/BLUE SHIELD | Admitting: Internal Medicine

## 2017-08-14 ENCOUNTER — Encounter: Payer: Self-pay | Admitting: *Deleted

## 2017-08-14 NOTE — Progress Notes (Deleted)
    Referring- Reason for referral-Dyspnea  HPI: 36 year old female for evaluation of dyspnea at request of . Chest x-ray October 2018 showed no active cardiopulmonary disease.   Current Outpatient Medications  Medication Sig Dispense Refill  . HYDROcodone-acetaminophen (NORCO/VICODIN) 5-325 MG tablet Take 1 tablet by mouth every 4 (four) hours as needed. 10 tablet 0  . nitrofurantoin, macrocrystal-monohydrate, (MACROBID) 100 MG capsule Take 1 capsule (100 mg total) by mouth 2 (two) times daily. 10 capsule 0  . ondansetron (ZOFRAN ODT) 4 MG disintegrating tablet Take 1 tablet (4 mg total) by mouth every 8 (eight) hours as needed. 10 tablet 0  . phenazopyridine (PYRIDIUM) 97 MG tablet Take 97 mg by mouth 3 (three) times daily as needed for pain.     No current facility-administered medications for this visit.     No Known Allergies  Past Medical History:  Diagnosis Date  . Medical history non-contributory     Past Surgical History:  Procedure Laterality Date  . NO PAST SURGERIES      Social History   Socioeconomic History  . Marital status: Married    Spouse name: Not on file  . Number of children: Not on file  . Years of education: Not on file  . Highest education level: Not on file  Social Needs  . Financial resource strain: Not on file  . Food insecurity - worry: Not on file  . Food insecurity - inability: Not on file  . Transportation needs - medical: Not on file  . Transportation needs - non-medical: Not on file  Occupational History  . Not on file  Tobacco Use  . Smoking status: Never Smoker  . Smokeless tobacco: Never Used  Substance and Sexual Activity  . Alcohol use: No  . Drug use: No  . Sexual activity: Not on file  Other Topics Concern  . Not on file  Social History Narrative  . Not on file    No family history on file.  ROS: no fevers or chills, productive cough, hemoptysis, dysphasia, odynophagia, melena, hematochezia, dysuria, hematuria, rash,  seizure activity, orthopnea, PND, pedal edema, claudication. Remaining systems are negative.  Physical Exam:   unknown if currently breastfeeding.  General:  Well developed/well nourished in NAD Skin warm/dry Patient not depressed No peripheral clubbing Back-normal HEENT-normal/normal eyelids Neck supple/normal carotid upstroke bilaterally; no bruits; no JVD; no thyromegaly chest - CTA/ normal expansion CV - RRR/normal S1 and S2; no murmurs, rubs or gallops;  PMI nondisplaced Abdomen -NT/ND, no HSM, no mass, + bowel sounds, no bruit 2+ femoral pulses, no bruits Ext-no edema, chords, 2+ DP Neuro-grossly nonfocal  ECG - personally reviewed  A/P  1  Wanda MillersBrian Kawana Hegel, MD

## 2017-08-27 ENCOUNTER — Ambulatory Visit: Payer: BLUE CROSS/BLUE SHIELD | Admitting: Cardiology

## 2017-10-22 NOTE — Progress Notes (Deleted)
    Referring- Reason for referral-Dyspnea  HPI: 37 year old female for evaluation of dyspnea at request of .  Chest x-ray October 2018 showed no active cardiopulmonary disease.  Current Outpatient Medications  Medication Sig Dispense Refill  . HYDROcodone-acetaminophen (NORCO/VICODIN) 5-325 MG tablet Take 1 tablet by mouth every 4 (four) hours as needed. 10 tablet 0  . nitrofurantoin, macrocrystal-monohydrate, (MACROBID) 100 MG capsule Take 1 capsule (100 mg total) by mouth 2 (two) times daily. 10 capsule 0  . ondansetron (ZOFRAN ODT) 4 MG disintegrating tablet Take 1 tablet (4 mg total) by mouth every 8 (eight) hours as needed. 10 tablet 0  . phenazopyridine (PYRIDIUM) 97 MG tablet Take 97 mg by mouth 3 (three) times daily as needed for pain.     No current facility-administered medications for this visit.     No Known Allergies  Past Medical History:  Diagnosis Date  . Medical history non-contributory     Past Surgical History:  Procedure Laterality Date  . NO PAST SURGERIES      Social History   Socioeconomic History  . Marital status: Married    Spouse name: Not on file  . Number of children: Not on file  . Years of education: Not on file  . Highest education level: Not on file  Social Needs  . Financial resource strain: Not on file  . Food insecurity - worry: Not on file  . Food insecurity - inability: Not on file  . Transportation needs - medical: Not on file  . Transportation needs - non-medical: Not on file  Occupational History  . Not on file  Tobacco Use  . Smoking status: Never Smoker  . Smokeless tobacco: Never Used  Substance and Sexual Activity  . Alcohol use: No  . Drug use: No  . Sexual activity: Not on file  Other Topics Concern  . Not on file  Social History Narrative  . Not on file    Family History  Problem Relation Age of Onset  . Hypertension Mother   . Heart disease Father   . Diabetes Father   . Heart Problems Father    Pacemaker  . Down syndrome Other        2 nieces  . Thalassemia Other     ROS: no fevers or chills, productive cough, hemoptysis, dysphasia, odynophagia, melena, hematochezia, dysuria, hematuria, rash, seizure activity, orthopnea, PND, pedal edema, claudication. Remaining systems are negative.  Physical Exam:   unknown if currently breastfeeding.  General:  Well developed/well nourished in NAD Skin warm/dry Patient not depressed No peripheral clubbing Back-normal HEENT-normal/normal eyelids Neck supple/normal carotid upstroke bilaterally; no bruits; no JVD; no thyromegaly chest - CTA/ normal expansion CV - RRR/normal S1 and S2; no murmurs, rubs or gallops;  PMI nondisplaced Abdomen -NT/ND, no HSM, no mass, + bowel sounds, no bruit 2+ femoral pulses, no bruits Ext-no edema, chords, 2+ DP Neuro-grossly nonfocal  ECG - personally reviewed  A/P  1  Olga MillersBrian Crenshaw, MD

## 2017-10-25 ENCOUNTER — Ambulatory Visit: Payer: BLUE CROSS/BLUE SHIELD | Admitting: Cardiology

## 2018-01-15 DIAGNOSIS — H66001 Acute suppurative otitis media without spontaneous rupture of ear drum, right ear: Secondary | ICD-10-CM | POA: Diagnosis not present

## 2018-07-21 DIAGNOSIS — M546 Pain in thoracic spine: Secondary | ICD-10-CM | POA: Diagnosis not present

## 2018-07-22 ENCOUNTER — Ambulatory Visit
Admission: RE | Admit: 2018-07-22 | Discharge: 2018-07-22 | Disposition: A | Discharge status: Home / Self Care | Payer: BLUE CROSS/BLUE SHIELD | Source: Ambulatory Visit | Attending: Family Medicine | Admitting: Family Medicine

## 2018-07-22 ENCOUNTER — Other Ambulatory Visit: Payer: Self-pay | Admitting: Family Medicine

## 2018-07-22 DIAGNOSIS — R200 Anesthesia of skin: Secondary | ICD-10-CM | POA: Diagnosis not present

## 2018-07-22 DIAGNOSIS — M47814 Spondylosis without myelopathy or radiculopathy, thoracic region: Secondary | ICD-10-CM | POA: Diagnosis not present

## 2018-07-22 DIAGNOSIS — R52 Pain, unspecified: Secondary | ICD-10-CM

## 2018-11-13 DIAGNOSIS — J101 Influenza due to other identified influenza virus with other respiratory manifestations: Secondary | ICD-10-CM | POA: Diagnosis not present

## 2018-11-13 DIAGNOSIS — B349 Viral infection, unspecified: Secondary | ICD-10-CM | POA: Diagnosis not present

## 2018-11-18 DIAGNOSIS — Z131 Encounter for screening for diabetes mellitus: Secondary | ICD-10-CM | POA: Diagnosis not present

## 2018-11-18 DIAGNOSIS — Z833 Family history of diabetes mellitus: Secondary | ICD-10-CM | POA: Diagnosis not present

## 2018-11-18 DIAGNOSIS — Z0000 Encounter for general adult medical examination without abnormal findings: Secondary | ICD-10-CM | POA: Diagnosis not present

## 2018-11-18 DIAGNOSIS — Z13220 Encounter for screening for lipoid disorders: Secondary | ICD-10-CM | POA: Diagnosis not present

## 2018-11-18 DIAGNOSIS — Z8249 Family history of ischemic heart disease and other diseases of the circulatory system: Secondary | ICD-10-CM | POA: Diagnosis not present

## 2019-05-14 DIAGNOSIS — Z01419 Encounter for gynecological examination (general) (routine) without abnormal findings: Secondary | ICD-10-CM | POA: Diagnosis not present

## 2019-05-15 ENCOUNTER — Other Ambulatory Visit: Payer: Self-pay | Admitting: Obstetrics & Gynecology

## 2019-05-15 DIAGNOSIS — Z1231 Encounter for screening mammogram for malignant neoplasm of breast: Secondary | ICD-10-CM

## 2019-06-29 ENCOUNTER — Other Ambulatory Visit: Payer: Self-pay

## 2019-06-29 ENCOUNTER — Ambulatory Visit
Admission: RE | Admit: 2019-06-29 | Discharge: 2019-06-29 | Disposition: A | Discharge status: Home / Self Care | Payer: BLUE CROSS/BLUE SHIELD | Source: Ambulatory Visit | Attending: Obstetrics & Gynecology | Admitting: Obstetrics & Gynecology

## 2019-06-29 DIAGNOSIS — Z1231 Encounter for screening mammogram for malignant neoplasm of breast: Secondary | ICD-10-CM

## 2019-09-24 DIAGNOSIS — J028 Acute pharyngitis due to other specified organisms: Secondary | ICD-10-CM | POA: Diagnosis not present

## 2019-09-24 DIAGNOSIS — Z20828 Contact with and (suspected) exposure to other viral communicable diseases: Secondary | ICD-10-CM | POA: Diagnosis not present

## 2019-10-06 DIAGNOSIS — Z20828 Contact with and (suspected) exposure to other viral communicable diseases: Secondary | ICD-10-CM | POA: Diagnosis not present

## 2019-12-28 ENCOUNTER — Ambulatory Visit: Payer: BC Managed Care – PPO

## 2020-01-02 ENCOUNTER — Ambulatory Visit: Payer: BC Managed Care – PPO | Attending: Internal Medicine

## 2020-01-02 DIAGNOSIS — Z23 Encounter for immunization: Secondary | ICD-10-CM

## 2020-01-02 NOTE — Progress Notes (Signed)
   Covid-19 Vaccination Clinic  Name:  Sadiyah Kangas    MRN: [id number] DOB: June 19, 1981  01/02/2020  Ms. Derossett was observed post Covid-19 immunization for 15 minutes without incident. She was provided with Vaccine Information Sheet and instruction to access the V-Safe system.   Ms. Hush was instructed to call 911 with any severe reactions post vaccine: Marland Kitchen Difficulty breathing  . Swelling of face and throat  . A fast heartbeat  . A bad rash all over body  . Dizziness and weakness   Immunizations Administered    Name Date Dose VIS Date Route   Pfizer COVID-19 Vaccine 01/02/2020  2:50 PM 0.3 mL 09/11/2019 Intramuscular   Manufacturer: [employer], [employer]   Lot: [id number]   NDC: [address]-8

## 2020-01-05 DIAGNOSIS — H9201 Otalgia, right ear: Secondary | ICD-10-CM | POA: Diagnosis not present

## 2020-01-05 DIAGNOSIS — H66011 Acute suppurative otitis media with spontaneous rupture of ear drum, right ear: Secondary | ICD-10-CM | POA: Diagnosis not present

## 2020-01-14 DIAGNOSIS — H7291 Unspecified perforation of tympanic membrane, right ear: Secondary | ICD-10-CM | POA: Diagnosis not present

## 2020-01-14 DIAGNOSIS — H9201 Otalgia, right ear: Secondary | ICD-10-CM | POA: Diagnosis not present

## 2020-01-23 ENCOUNTER — Emergency Department (HOSPITAL_COMMUNITY)
Admission: EM | Admit: 2020-01-23 | Discharge: 2020-01-23 | Disposition: A | Discharge status: Home / Self Care | Payer: BC Managed Care – PPO | Attending: Emergency Medicine | Admitting: Emergency Medicine

## 2020-01-23 ENCOUNTER — Other Ambulatory Visit: Payer: Self-pay

## 2020-01-23 DIAGNOSIS — Z20822 Contact with and (suspected) exposure to covid-19: Secondary | ICD-10-CM | POA: Insufficient documentation

## 2020-01-23 DIAGNOSIS — J029 Acute pharyngitis, unspecified: Secondary | ICD-10-CM | POA: Diagnosis not present

## 2020-01-23 DIAGNOSIS — H7201 Central perforation of tympanic membrane, right ear: Secondary | ICD-10-CM | POA: Insufficient documentation

## 2020-01-23 DIAGNOSIS — Z79899 Other long term (current) drug therapy: Secondary | ICD-10-CM | POA: Insufficient documentation

## 2020-01-23 DIAGNOSIS — R5383 Other fatigue: Secondary | ICD-10-CM | POA: Insufficient documentation

## 2020-01-23 DIAGNOSIS — R509 Fever, unspecified: Secondary | ICD-10-CM | POA: Diagnosis not present

## 2020-01-23 DIAGNOSIS — H6691 Otitis media, unspecified, right ear: Secondary | ICD-10-CM

## 2020-01-23 DIAGNOSIS — H7291 Unspecified perforation of tympanic membrane, right ear: Secondary | ICD-10-CM | POA: Diagnosis not present

## 2020-01-23 LAB — SARS CORONAVIRUS 2 (TAT 6-24 HRS): SARS Coronavirus 2: NEGATIVE

## 2020-01-23 MED ORDER — CIPROFLOXACIN HCL 0.3 % OP SOLN: 1.0000 [drp] | OPHTHALMIC | 0 refills | Status: AC

## 2020-01-23 MED ORDER — SULFAMETHOXAZOLE-TRIMETHOPRIM 400-80 MG PO TABS: 2.0000 | ORAL_TABLET | Freq: Two times a day (BID) | ORAL | 0 refills | Status: AC

## 2020-01-23 NOTE — ED Triage Notes (Signed)
Pt reports lymph node swelling in neck and under arms since yesterday with sore throat.  States she received 1st COVID shot on 4/3.  Reports generalized body aches.  Taking antibiotic x 2 weeks for ear infection.

## 2020-01-23 NOTE — ED Provider Notes (Signed)
[facility] EMERGENCY DEPARTMENT Provider Note   CSN: [id number] Arrival date & time: 01/23/20  1038     History Chief Complaint  Patient presents with  . Sore Throat  . Generalized Body Aches    Wanda Peterson is a 39 y.o. female.  HPI  Patient is 39 year old female with no pertinent past medical history presented today with husband at bedside who acts as Nurse, learning disability for patient although she speaks fairly patient [place].  Per patient and husband: Patient has been experiencing right ear pain for 2 weeks.  She was originally prescribed amoxicillin by PCP who also did a culture swab was then switched to Bactrim.  She states that she has been on this for 9 days and continues to have pain.  She states that she continues to have body aches, she had 1 night of subjective fevers, she has some congestion and sore throat as well as a cough.  She states that she has checked her temperature at home and the warm as it has been is 99.  She has occasionally taken Tylenol 1-2 times daily 1 tablet for her discomfort.  Patient also notices fatigue.  Patient denies any chest pain, shortness breath, wheezing, nausea, vomiting, diarrhea, states that she has had no objective fevers at home.  Denies chills.     Past Medical History:  Diagnosis Date  . Medical history non-contributory     Patient Active Problem List   Diagnosis Date Noted  . Normal labor 12/14/2014  . Language barrier 12/14/2014  . Grand multiparity 12/14/2014  . Elderly multigravida 12/14/2014    Past Surgical History:  Procedure Laterality Date  . NO PAST SURGERIES       OB History    Gravida  6   Para  6   Term  6   Preterm      AB      Living  1     SAB      TAB      Ectopic      Multiple  0   Live Births  1           Family History  Problem Relation Age of Onset  . Hypertension Mother   . Heart disease Father   . Diabetes Father   . Heart Problems Father        Pacemaker  .  Down syndrome Other        2 nieces  . Thalassemia Other     Social History   Tobacco Use  . Smoking status: Never Smoker  . Smokeless tobacco: Never Used  Substance Use Topics  . Alcohol use: No  . Drug use: No    Home Medications Prior to Admission medications   Medication Sig Start Date End Date Taking? Authorizing Provider  ciprofloxacin (CILOXAN) 0.3 % ophthalmic solution Place 1 drop into the right ear every 4 (four) hours while awake for 7 days. Administer 1 drop, every 2 hours, while awake, for 2 days. Then 1 drop, every 4 hours, while awake, for the next 5 days. 01/23/20 01/30/20  Gailen Shelter, PA  HYDROcodone-acetaminophen (NORCO/VICODIN) 5-325 MG tablet Take 1 tablet by mouth every 4 (four) hours as needed. 03/12/17   Jacalyn Lefevre, MD  nitrofurantoin, macrocrystal-monohydrate, (MACROBID) 100 MG capsule Take 1 capsule (100 mg total) by mouth 2 (two) times daily. 03/12/17   Khatri, Hina, PA-C  ondansetron (ZOFRAN ODT) 4 MG disintegrating tablet Take 1 tablet (4 mg total) by mouth every  8 (eight) hours as needed. 03/12/17   Jacalyn Lefevre, MD  phenazopyridine (PYRIDIUM) 97 MG tablet Take 97 mg by mouth 3 (three) times daily as needed for pain.    [provider]  sulfamethoxazole-trimethoprim (BACTRIM) 400-80 MG tablet Take 2 tablets by mouth 2 (two) times daily for 5 days. 01/23/20 01/28/20  Gailen Shelter, PA    Allergies    Patient has no known allergies.  Review of Systems   Review of Systems  Constitutional: Positive for fever. Negative for chills.  HENT: Positive for congestion, ear pain, postnasal drip, rhinorrhea and sore throat. Negative for sinus pain.   Eyes: Negative for pain and redness.  Respiratory: Positive for cough. Negative for chest tightness and shortness of breath.   Cardiovascular: Negative for chest pain.  Gastrointestinal: Negative for abdominal pain.  Musculoskeletal: Negative for neck pain.  Skin: Negative for rash.  Neurological:  Negative for dizziness and headaches.  Psychiatric/Behavioral: Negative for sleep disturbance.    Physical Exam Updated Vital Signs BP 97/64 (BP Location: Left Arm)   Pulse 91   Temp 98.8 F (37.1 C) (Oral)   Resp 17   Ht 5\' 6"  (1.676 m)   LMP 01/17/2020   SpO2 99%   BMI 27.44 kg/m   Physical Exam Vitals and nursing note reviewed.  Constitutional:      General: She is not in acute distress.    Comments: Somewhat fatigued appearing.  Pleasant 39 year old female in no acute distress.  HENT:     Head: Normocephalic and atraumatic.     Ears:     Comments: Left TM, EAC and external ear appear normal  Right TM with central perforation, EAC normal, scant discharge from TM perforation    Nose: Nose normal.     Mouth/Throat:     Mouth: Mucous membranes are moist.     Comments: Posterior pharynx without erythema.  Tonsils are either too small to see or surgically absent.  No exudates.  Uvula is midline.  Posterior nasal drainage with some cobblestoning evident. Eyes:     General: No scleral icterus. Neck:     Comments: Right-sided cervical anterior lymphadenopathy Cardiovascular:     Rate and Rhythm: Normal rate and regular rhythm.     Pulses: Normal pulses.     Heart sounds: Normal heart sounds.  Pulmonary:     Effort: Pulmonary effort is normal. No respiratory distress.     Breath sounds: No wheezing.  Abdominal:     Palpations: Abdomen is soft.     Tenderness: There is no abdominal tenderness. There is no guarding or rebound.  Musculoskeletal:     Cervical back: Normal range of motion.     Right lower leg: No edema.     Left lower leg: No edema.     Comments: Tenderness to palpation of left axilla no lymphadenopathy noted  Skin:    General: Skin is warm and dry.     Capillary Refill: Capillary refill takes less than 2 seconds.  Neurological:     Mental Status: She is alert. Mental status is at baseline.  Psychiatric:        Mood and Affect: Mood normal.         Behavior: Behavior normal.     ED Results / Procedures / Treatments   Labs (all labs ordered are listed, but only abnormal results are displayed) Labs Reviewed  SARS CORONAVIRUS 2 (TAT 6-24 HRS)    EKG None  Radiology No results found.  Procedures  Procedures (including critical care time)  Medications Ordered in ED Medications - No data to display  ED Course  I have reviewed the triage vital signs and the nursing notes.  Pertinent labs & imaging results that were available during my care of the patient were reviewed by me and considered in my medical decision making (see chart for details).    MDM Rules/Calculators/A&P                      Patient has ear infection has been treated with amoxicillin and then transitioned to Bactrim.  She is on her last day of antibiotics  Physical exam notable for TM perforation and what appears to be scant purulent discharge. She is still not feeling well.  She has some anterior cervical lymphadenopathy that is mildly tender freely mobile lymph nodes.  There is to palpation of left axilla however no notable lymphadenopathy here.  She is very concerned however is well-appearing.  Has vital signs within normal limits.  Low concern for deep infection.  Doubt abscess, malignant otitis media.  She has a history of diabetes.  She is otherwise healthy.  I discussed this case with my attending physician who cosigned this note including patient's presenting symptoms, physical exam, and planned diagnostics and interventions. Attending physician stated agreement with plan or made changes to plan which were implemented.   Attending physician assessed patient at bedside.  Plan is to add 5 extra days of Bactrim, add in antibiotic eardrops compatible with TM perforation, and have patient follow-up with ear nose and throat.  She will use Tylenol and ibuprofen for pain.  Final Clinical Impression(s) / ED Diagnoses Final diagnoses:  Right otitis media,  unspecified otitis media type  Tympanic membrane central perforation, right    Rx / DC Orders ED Discharge Orders         Ordered    ciprofloxacin (CILOXAN) 0.3 % ophthalmic solution  Every 4 hours while awake     01/23/20 1412    sulfamethoxazole-trimethoprim (BACTRIM) 400-80 MG tablet  2 times daily     01/23/20 1412           Pati Gallo [place], [place] 01/23/20 1416    Wyvonnia Dusky, MD 01/23/20 1733

## 2020-01-23 NOTE — Discharge Instructions (Addendum)
Please take antibiotics as prescribed. Please use Tylenol or ibuprofen for pain.  You may use 600 mg ibuprofen every 6 hours or 1000 mg of Tylenol every 6 hours.  You may choose to alternate between the 2.  This would be most effective.  Not to exceed 4 g of Tylenol within 24 hours.  Not to exceed 3200 mg ibuprofen 24 hours.  Please use eardrops as prescribed.  Please call Monday morning to make an appointment with the [facility].  Please call and let them know that you were seen in the emergency department and has had recurrent ear infections and have a TM perforation with worsening symptoms.  They will fit you in when they are able to.

## 2020-01-25 DIAGNOSIS — Z1152 Encounter for screening for COVID-19: Secondary | ICD-10-CM | POA: Diagnosis not present

## 2020-01-25 DIAGNOSIS — R5383 Other fatigue: Secondary | ICD-10-CM | POA: Diagnosis not present

## 2020-01-25 DIAGNOSIS — Z889 Allergy status to unspecified drugs, medicaments and biological substances status: Secondary | ICD-10-CM | POA: Diagnosis not present

## 2020-01-25 DIAGNOSIS — R509 Fever, unspecified: Secondary | ICD-10-CM | POA: Diagnosis not present

## 2020-01-25 DIAGNOSIS — R591 Generalized enlarged lymph nodes: Secondary | ICD-10-CM | POA: Diagnosis not present

## 2020-01-26 ENCOUNTER — Ambulatory Visit: Payer: BC Managed Care – PPO

## 2020-02-02 ENCOUNTER — Ambulatory Visit: Payer: BC Managed Care – PPO | Attending: Internal Medicine

## 2020-02-02 DIAGNOSIS — Z23 Encounter for immunization: Secondary | ICD-10-CM

## 2020-02-02 NOTE — Progress Notes (Signed)
   Covid-19 Vaccination Clinic  Name:  Parys Elenbaas    MRN: [id number] DOB: 10/26/1980  02/02/2020  Ms. Renz was observed post Covid-19 immunization for 15 minutes without incident. She was provided with Vaccine Information Sheet and instruction to access the V-Safe system.   Ms. Lemere was instructed to call 911 with any severe reactions post vaccine: Marland Kitchen Difficulty breathing  . Swelling of face and throat  . A fast heartbeat  . A bad rash all over body  . Dizziness and weakness   Immunizations Administered    Name Date Dose VIS Date Route   Pfizer COVID-19 Vaccine 02/02/2020  4:22 PM 0.3 mL 11/25/2018 Intramuscular   Manufacturer: [employer], [employer]   Lot: [id number]   NDC: [address]-9

## 2020-02-04 DIAGNOSIS — H9201 Otalgia, right ear: Secondary | ICD-10-CM | POA: Diagnosis not present

## 2020-02-04 DIAGNOSIS — L298 Other pruritus: Secondary | ICD-10-CM | POA: Diagnosis not present

## 2020-02-12 DIAGNOSIS — Z01419 Encounter for gynecological examination (general) (routine) without abnormal findings: Secondary | ICD-10-CM | POA: Diagnosis not present

## 2020-02-19 DIAGNOSIS — H6690 Otitis media, unspecified, unspecified ear: Secondary | ICD-10-CM | POA: Diagnosis not present

## 2020-03-01 DIAGNOSIS — H6690 Otitis media, unspecified, unspecified ear: Secondary | ICD-10-CM | POA: Diagnosis not present

## 2020-03-02 DIAGNOSIS — H9011 Conductive hearing loss, unilateral, right ear, with unrestricted hearing on the contralateral side: Secondary | ICD-10-CM | POA: Diagnosis not present

## 2020-03-02 DIAGNOSIS — H9211 Otorrhea, right ear: Secondary | ICD-10-CM | POA: Diagnosis not present

## 2020-03-13 DIAGNOSIS — Z20822 Contact with and (suspected) exposure to covid-19: Secondary | ICD-10-CM | POA: Diagnosis not present

## 2020-03-15 DIAGNOSIS — H7291 Unspecified perforation of tympanic membrane, right ear: Secondary | ICD-10-CM | POA: Diagnosis not present

## 2020-03-15 DIAGNOSIS — H9011 Conductive hearing loss, unilateral, right ear, with unrestricted hearing on the contralateral side: Secondary | ICD-10-CM | POA: Diagnosis not present

## 2020-06-28 ENCOUNTER — Other Ambulatory Visit: Payer: Self-pay | Admitting: Obstetrics & Gynecology

## 2020-06-28 DIAGNOSIS — Z1231 Encounter for screening mammogram for malignant neoplasm of breast: Secondary | ICD-10-CM

## 2020-07-15 ENCOUNTER — Ambulatory Visit
Admission: RE | Admit: 2020-07-15 | Discharge: 2020-07-15 | Disposition: A | Discharge status: Home / Self Care | Payer: BC Managed Care – PPO | Source: Ambulatory Visit | Attending: Obstetrics & Gynecology | Admitting: Obstetrics & Gynecology

## 2020-07-15 ENCOUNTER — Other Ambulatory Visit: Payer: Self-pay

## 2020-07-15 DIAGNOSIS — Z1231 Encounter for screening mammogram for malignant neoplasm of breast: Secondary | ICD-10-CM

## 2020-07-20 DIAGNOSIS — R202 Paresthesia of skin: Secondary | ICD-10-CM | POA: Diagnosis not present

## 2020-07-20 DIAGNOSIS — R1110 Vomiting, unspecified: Secondary | ICD-10-CM | POA: Diagnosis not present

## 2020-09-01 DIAGNOSIS — H9011 Conductive hearing loss, unilateral, right ear, with unrestricted hearing on the contralateral side: Secondary | ICD-10-CM | POA: Diagnosis not present

## 2020-09-01 DIAGNOSIS — H7291 Unspecified perforation of tympanic membrane, right ear: Secondary | ICD-10-CM | POA: Diagnosis not present

## 2021-01-18 ENCOUNTER — Emergency Department (HOSPITAL_BASED_OUTPATIENT_CLINIC_OR_DEPARTMENT_OTHER)
Admission: EM | Admit: 2021-01-18 | Discharge: 2021-01-18 | Disposition: A | Discharge status: Home / Self Care | Payer: 59 | Attending: Emergency Medicine | Admitting: Emergency Medicine

## 2021-01-18 ENCOUNTER — Other Ambulatory Visit: Payer: Self-pay

## 2021-01-18 ENCOUNTER — Encounter (HOSPITAL_BASED_OUTPATIENT_CLINIC_OR_DEPARTMENT_OTHER): Payer: Self-pay | Admitting: Emergency Medicine

## 2021-01-18 DIAGNOSIS — R1030 Lower abdominal pain, unspecified: Secondary | ICD-10-CM | POA: Diagnosis present

## 2021-01-18 DIAGNOSIS — E860 Dehydration: Secondary | ICD-10-CM | POA: Insufficient documentation

## 2021-01-18 DIAGNOSIS — K529 Noninfective gastroenteritis and colitis, unspecified: Secondary | ICD-10-CM | POA: Insufficient documentation

## 2021-01-18 LAB — CBC WITH DIFFERENTIAL/PLATELET
Abs Immature Granulocytes: 0.01 10*3/uL (ref 0.00–0.07)
Basophils Absolute: 0 10*3/uL (ref 0.0–0.1)
Basophils Relative: 1 %
Eosinophils Absolute: 0.3 10*3/uL (ref 0.0–0.5)
Eosinophils Relative: 6 %
HCT: 39.6 % (ref 36.0–46.0)
Hemoglobin: 12.6 g/dL (ref 12.0–15.0)
Immature Granulocytes: 0 %
Lymphocytes Relative: 41 %
Lymphs Abs: 2.5 10*3/uL (ref 0.7–4.0)
MCH: 27.3 pg (ref 26.0–34.0)
MCHC: 31.8 g/dL (ref 30.0–36.0)
MCV: 85.9 fL (ref 80.0–100.0)
Monocytes Absolute: 0.5 10*3/uL (ref 0.1–1.0)
Monocytes Relative: 8 %
Neutro Abs: 2.7 10*3/uL (ref 1.7–7.7)
Neutrophils Relative %: 44 %
Platelets: 397 10*3/uL (ref 150–400)
RBC: 4.61 MIL/uL (ref 3.87–5.11)
RDW: 12.7 % (ref 11.5–15.5)
WBC: 6.1 10*3/uL (ref 4.0–10.5)
nRBC: 0 % (ref 0.0–0.2)

## 2021-01-18 LAB — URINALYSIS, ROUTINE W REFLEX MICROSCOPIC
Bilirubin Urine: NEGATIVE
Glucose, UA: NEGATIVE mg/dL
Ketones, ur: NEGATIVE mg/dL
Leukocytes,Ua: NEGATIVE
Nitrite: NEGATIVE
Protein, ur: NEGATIVE mg/dL
Specific Gravity, Urine: >1.03 — ABNORMAL HIGH (ref 1.005–1.030)
pH: 6 (ref 5.0–8.0)

## 2021-01-18 LAB — COMPREHENSIVE METABOLIC PANEL
ALT: 10 U/L (ref 0–44)
AST: 14 U/L — ABNORMAL LOW (ref 15–41)
Albumin: 3.6 g/dL (ref 3.5–5.0)
Alkaline Phosphatase: 49 U/L (ref 38–126)
Anion gap: 10 (ref 5–15)
BUN: 16 mg/dL (ref 6–20)
CO2: 22 mmol/L (ref 22–32)
Calcium: 9.1 mg/dL (ref 8.9–10.3)
Chloride: 106 mmol/L (ref 98–111)
Creatinine, Ser: 0.59 mg/dL (ref 0.44–1.00)
GFR, Estimated: >60 mL/min (ref >60)
Glucose, Bld: 118 mg/dL — ABNORMAL HIGH (ref 70–99)
Potassium: 3.5 mmol/L (ref 3.5–5.1)
Sodium: 138 mmol/L (ref 135–145)
Total Bilirubin: 0.2 mg/dL — ABNORMAL LOW (ref 0.3–1.2)
Total Protein: 6.8 g/dL (ref 6.5–8.1)

## 2021-01-18 LAB — URINALYSIS, MICROSCOPIC (REFLEX)

## 2021-01-18 LAB — PREGNANCY, URINE: Preg Test, Ur: NEGATIVE

## 2021-01-18 LAB — LIPASE, BLOOD: Lipase: 27 U/L (ref 11–51)

## 2021-01-18 LAB — TROPONIN I (HIGH SENSITIVITY): Troponin I (High Sensitivity): <2 ng/L (ref <18)

## 2021-01-18 MED ORDER — ONDANSETRON 8 MG PO TBDP: 8.0000 mg | ORAL_TABLET | Freq: Three times a day (TID) | ORAL | 0 refills | Status: DC | PRN

## 2021-01-18 MED ORDER — ONDANSETRON HCL 4 MG/2ML IJ SOLN
4.0000 mg | Freq: Once | INTRAMUSCULAR | Status: AC
  Administered 2021-01-18: 4 mg via INTRAVENOUS
  Filled 2021-01-18: qty 2

## 2021-01-18 MED ORDER — SODIUM CHLORIDE 0.9 % IV BOLUS
1000.0000 mL | Freq: Once | INTRAVENOUS | Status: AC
Start: 2021-01-18 — End: 2021-01-18
  Administered 2021-01-18: 1000 mL via INTRAVENOUS

## 2021-01-18 NOTE — ED Notes (Signed)
Pt is aware she needs a urine sample 

## 2021-01-18 NOTE — ED Provider Notes (Signed)
[facility] Provider Note: Lowella Dell, MD, FACEP  CSN: [id number] MRN: [id number] ARRIVAL: 01/18/21 at 0253 ROOM: [facility]09/[facility]09   CHIEF COMPLAINT  Vomiting and Abdominal Pain   HISTORY OF PRESENT ILLNESS  01/18/21 3:13 AM Wanda Peterson is a 40 y.o. female without significant past medical history.  She awakened from sleep this morning about 1:30 AM with abdominal pain.  The abdominal pain is located in her lower abdomen/suprapubic region.  She rates the pain is a 6 out of 10. It is somewhat worse with movement or palpation.  She has had associated nausea, vomiting and diarrhea.  She estimates she has vomited seven times.  She is also having some paresthesias in her right hand and feels like it is difficult to take a deep breath.     History reviewed. No pertinent past medical history.  Past Surgical History:  Procedure Laterality Date  . NO PAST SURGERIES      Family History  Problem Relation Age of Onset  . Hypertension Mother   . Heart disease Father   . Diabetes Father   . Heart Problems Father        Pacemaker  . Down syndrome Other        2 nieces  . Thalassemia Other     Social History   Tobacco Use  . Smoking status: Never Smoker  . Smokeless tobacco: Never Used  Substance Use Topics  . Alcohol use: No  . Drug use: No    Prior to Admission medications   Medication Sig Start Date End Date Taking? Authorizing Provider  ondansetron (ZOFRAN ODT) 8 MG disintegrating tablet Take 1 tablet (8 mg total) by mouth every 8 (eight) hours as needed for nausea or vomiting. 01/18/21  Yes Ladarrious Kirksey, MD    Allergies Patient has no known allergies.   REVIEW OF SYSTEMS  Negative except as noted here or in the History of Present Illness.   PHYSICAL EXAMINATION  Initial Vital Signs Blood pressure 123/87, pulse 87, temperature 98.2 F (36.8 C), temperature source Oral, resp. rate 16, height 5\' 6"  (1.676 m), weight 77.1 kg, SpO2 100 %.  Examination General:  Well-developed, well-nourished female in no acute distress; appearance consistent with age of record HENT: normocephalic; atraumatic Eyes: pupils equal, round and reactive to light; extraocular muscles intact Neck: supple Heart: regular rate and rhythm Lungs: clear to auscultation bilaterally Abdomen: soft; nondistended; mild suprapubic tenderness; no masses or hepatosplenomegaly; bowel sounds present Extremities: No deformity; full range of motion; pulses normal Neurologic: Awake, alert and oriented; motor function intact in all extremities and symmetric; sensation intact and symmetric in upper extremities; no facial droop Skin: Warm and dry Psychiatric: Anxious   RESULTS  Summary of this visit's results, reviewed and interpreted by myself:   EKG Interpretation  Date/Time:  Wednesday January 18 2021 03:12:05 EDT Ventricular Rate:  84 PR Interval:  153 QRS Duration: 97 QT Interval:  353 QTC Calculation: 418 R Axis:   36 Text Interpretation: Sinus rhythm Low voltage, precordial leads Abnormal R-wave progression, early transition No previous ECGs available Confirmed by Idabelle Mcpeters, 06-03-1980 (Jonny Ruiz) on 01/18/2021 3:14:49 AM      Laboratory Studies: Results for orders placed or performed during the hospital encounter of 01/18/21 (from the past 24 hour(s))  CBC with Differential/Platelet     Status: None   Collection Time: 01/18/21  3:25 AM  Result Value Ref Range   WBC 6.1 4.0 - 10.5 K/uL   RBC 4.61 3.87 - 5.11 MIL/uL  Hemoglobin 12.6 12.0 - 15.0 g/dL   HCT 89.2 11.9 - 41.7 %   MCV 85.9 80.0 - 100.0 fL   MCH 27.3 26.0 - 34.0 pg   MCHC 31.8 30.0 - 36.0 g/dL   RDW 40.8 14.4 - 81.8 %   Platelets 397 150 - 400 K/uL   nRBC 0.0 0.0 - 0.2 %   Neutrophils Relative % 44 %   Neutro Abs 2.7 1.7 - 7.7 K/uL   Lymphocytes Relative 41 %   Lymphs Abs 2.5 0.7 - 4.0 K/uL   Monocytes Relative 8 %   Monocytes Absolute 0.5 0.1 - 1.0 K/uL   Eosinophils Relative 6 %   Eosinophils Absolute 0.3 0.0 - 0.5  K/uL   Basophils Relative 1 %   Basophils Absolute 0.0 0.0 - 0.1 K/uL   Immature Granulocytes 0 %   Abs Immature Granulocytes 0.01 0.00 - 0.07 K/uL  Comprehensive metabolic panel     Status: Abnormal   Collection Time: 01/18/21  3:25 AM  Result Value Ref Range   Sodium 138 135 - 145 mmol/L   Potassium 3.5 3.5 - 5.1 mmol/L   Chloride 106 98 - 111 mmol/L   CO2 22 22 - 32 mmol/L   Glucose, Bld 118 (H) 70 - 99 mg/dL   BUN 16 6 - 20 mg/dL   Creatinine, Ser 5.63 0.44 - 1.00 mg/dL   Calcium 9.1 8.9 - 14.9 mg/dL   Total Protein 6.8 6.5 - 8.1 g/dL   Albumin 3.6 3.5 - 5.0 g/dL   AST 14 (L) 15 - 41 U/L   ALT 10 0 - 44 U/L   Alkaline Phosphatase 49 38 - 126 U/L   Total Bilirubin 0.2 (L) 0.3 - 1.2 mg/dL   GFR, Estimated >70 >26 mL/min   Anion gap 10 5 - 15  Lipase, blood     Status: None   Collection Time: 01/18/21  3:25 AM  Result Value Ref Range   Lipase 27 11 - 51 U/L  Urinalysis, Routine w reflex microscopic Urine, Clean Catch     Status: Abnormal   Collection Time: 01/18/21  3:25 AM  Result Value Ref Range   Color, Urine YELLOW YELLOW   APPearance CLEAR CLEAR   Specific Gravity, Urine >1.030 (H) 1.005 - 1.030   pH 6.0 5.0 - 8.0   Glucose, UA NEGATIVE NEGATIVE mg/dL   Hgb urine dipstick SMALL (A) NEGATIVE   Bilirubin Urine NEGATIVE NEGATIVE   Ketones, ur NEGATIVE NEGATIVE mg/dL   Protein, ur NEGATIVE NEGATIVE mg/dL   Nitrite NEGATIVE NEGATIVE   Leukocytes,Ua NEGATIVE NEGATIVE  Pregnancy, urine     Status: None   Collection Time: 01/18/21  3:25 AM  Result Value Ref Range   Preg Test, Ur NEGATIVE NEGATIVE  Troponin I (High Sensitivity)     Status: None   Collection Time: 01/18/21  3:25 AM  Result Value Ref Range   Troponin I (High Sensitivity) <2 <18 ng/L  Urinalysis, Microscopic (reflex)     Status: Abnormal   Collection Time: 01/18/21  3:25 AM  Result Value Ref Range   RBC / HPF 6-10 0 - 5 RBC/hpf   WBC, UA 0-5 0 - 5 WBC/hpf   Bacteria, UA MANY (A) NONE SEEN   Squamous  Epithelial / LPF 21-50 0 - 5   Mucus PRESENT    Imaging Studies: No results found.  ED COURSE and MDM  Nursing notes, initial and subsequent vitals signs, including pulse oximetry, reviewed and interpreted by myself.  Vitals:   01/18/21 0305 01/18/21 0330 01/18/21 0400 01/18/21 0430  BP:  110/80 111/78 117/82  Pulse:  73 70 77  Resp:  16 12 17   Temp:      TempSrc:      SpO2:  100% 100% 100%  Weight: 77.1 kg     Height: 5\' 6"  (1.676 m)      Medications  sodium chloride 0.9 % bolus 1,000 mL ( Intravenous Stopped 01/18/21 0429)  ondansetron (ZOFRAN) injection 4 mg (4 mg Intravenous Given 01/18/21 0330)   6:34 AM The patient is feeling better and her nausea has improved.  Her urinalysis suggest she is still dehydrated but she would prefer to be discharged home for oral rehydration at this time.  Her abdominal pain has resolved.  The cause of her symptoms is most consistent with a viral GI illness although she has no leukocytosis or fever.  She was advised to return if symptoms worsen.   PROCEDURES  Procedures   ED DIAGNOSES     ICD-10-CM   1. Gastroenteritis  K52.9   2. Dehydration  E86.0        Torianna Junio, 01/20/21, MD 01/18/21 [phone]

## 2021-01-18 NOTE — ED Triage Notes (Signed)
Pt reports awaking with abd pain, vomiting and diarrhea. Pt also c/o right arm numbness and hard taking a deep breath.

## 2021-04-26 IMAGING — MG MM DIGITAL SCREENING BILAT W/ TOMO W/ CAD
8 series · 8 of 24 positions shown · non-contrast
Comparison: None.

CLINICAL DATA: Screening. This is the patient's initial baseline
mammogram.

EXAM:
DIGITAL SCREENING BILATERAL MAMMOGRAM WITH TOMO AND CAD

[R CC synth-2D]
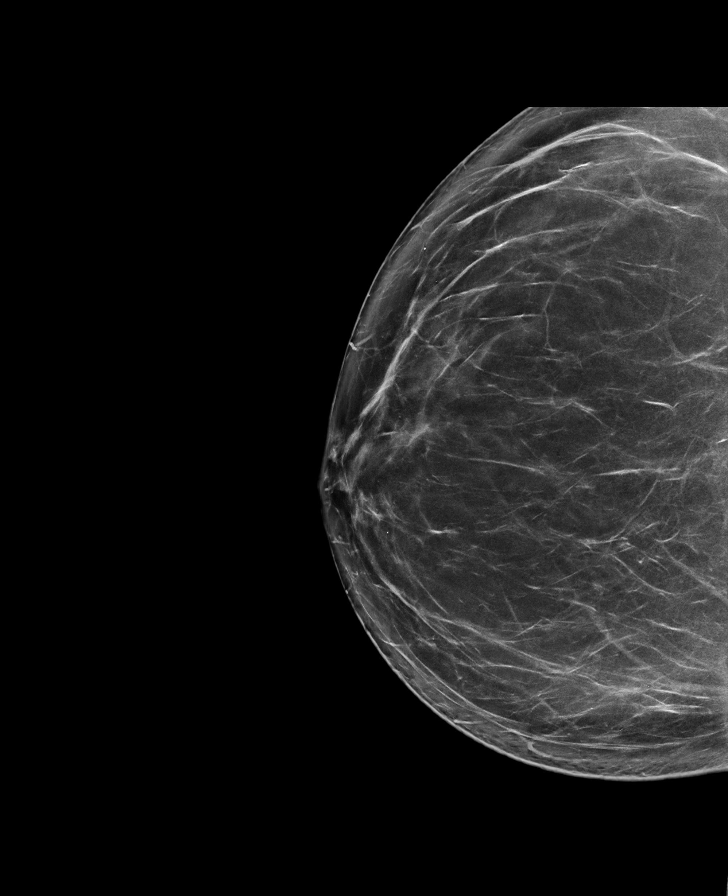

[L CC synth-2D]
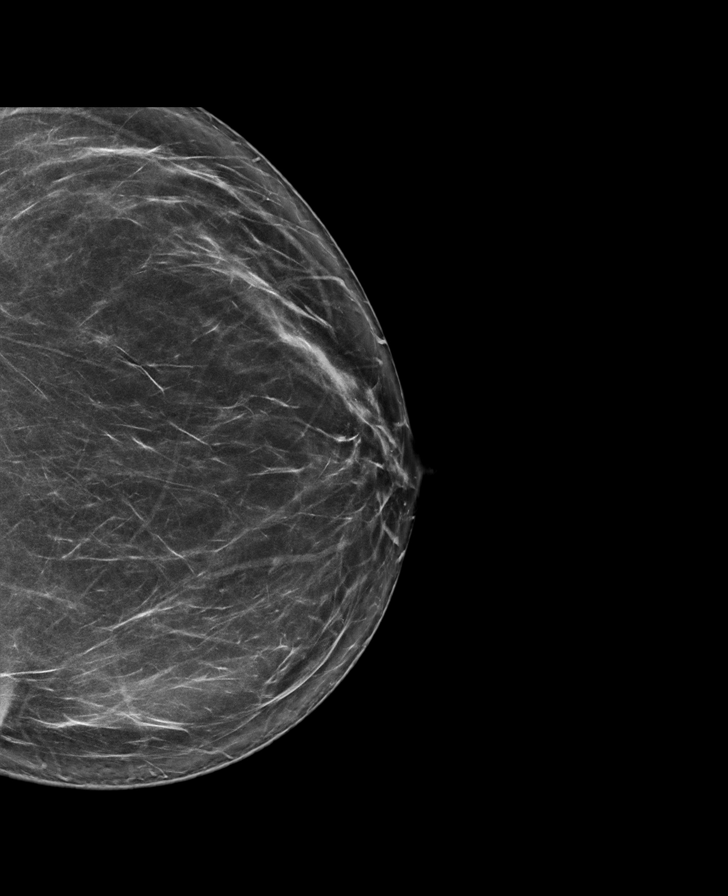

[L MLO synth-2D]
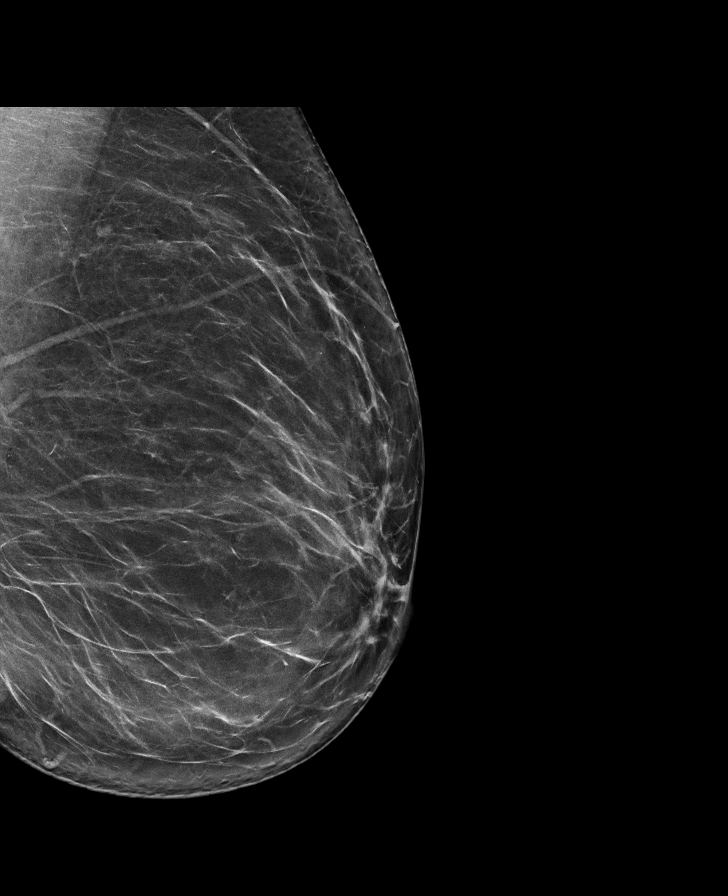

[R MLO synth-2D]
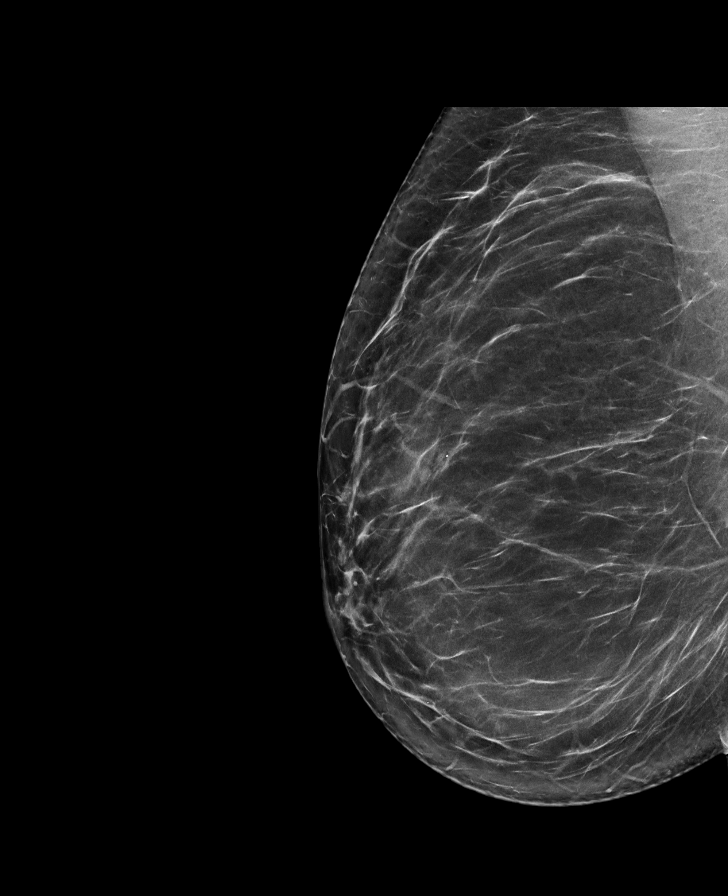

[L CC tomo · tomo slice 42/83.0]
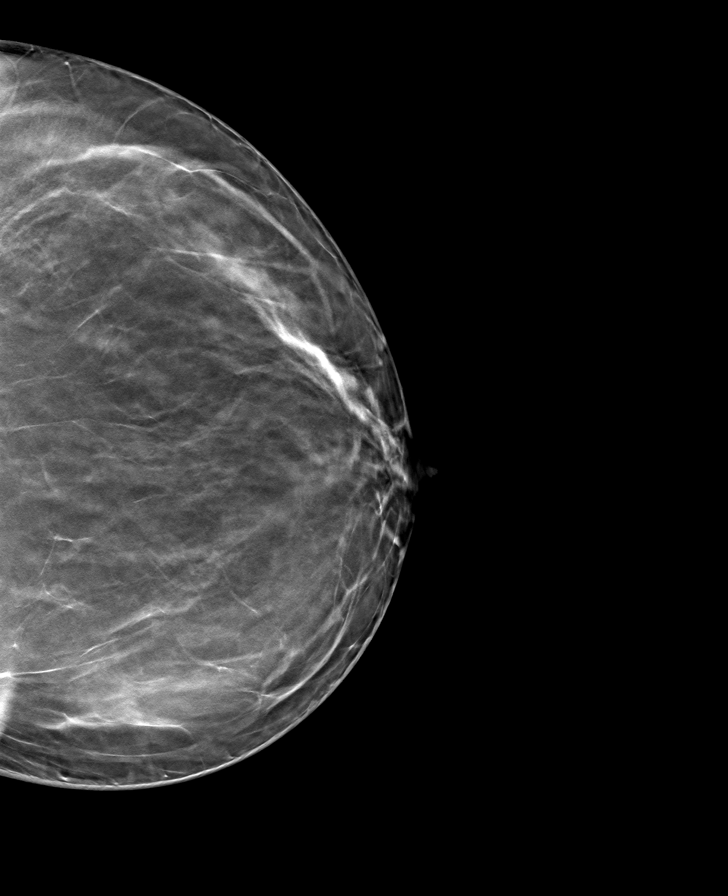

[L MLO tomo · tomo slice 43/84.0]
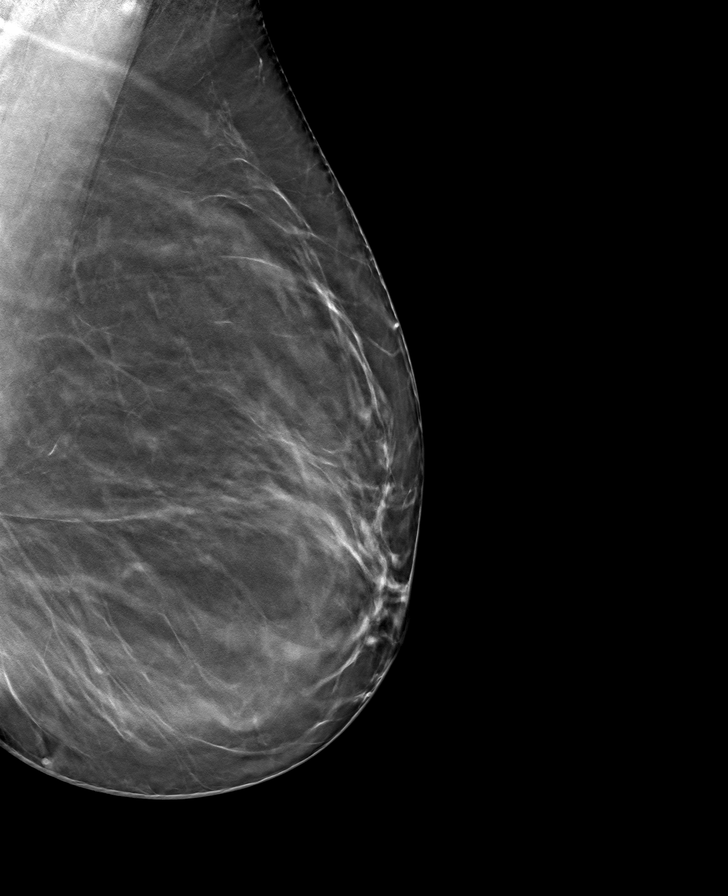

[R CC tomo · tomo slice 45/88.0]
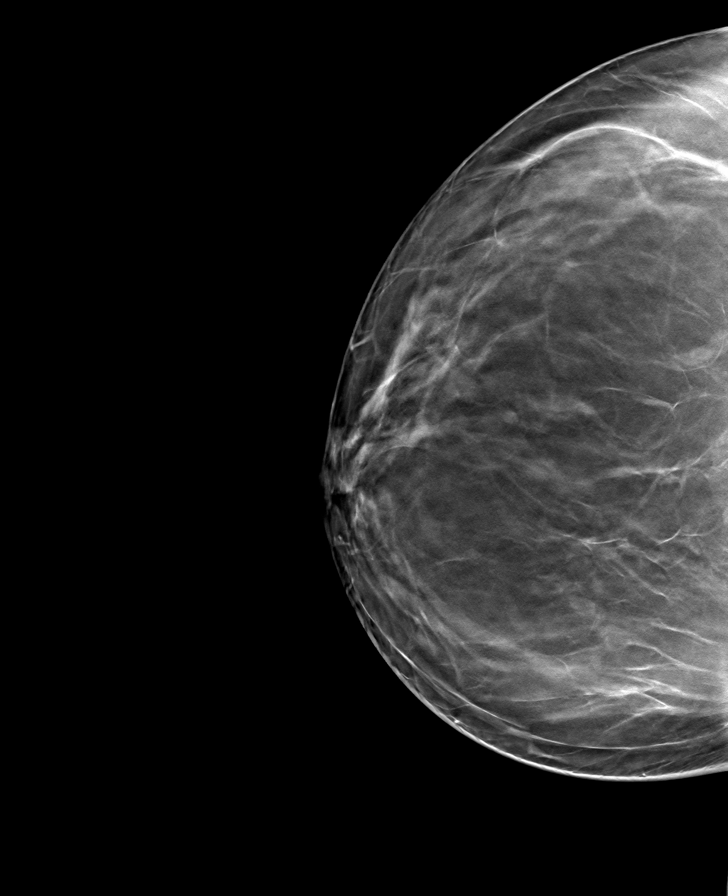

[R MLO tomo · tomo slice 43/86.0]
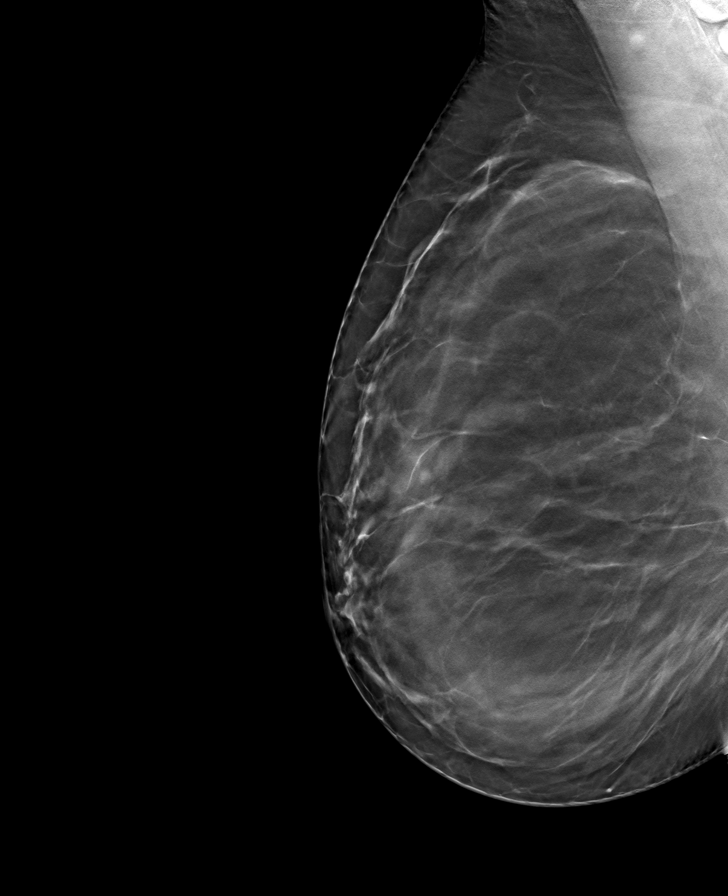

[8 of 24 positions shown; findings below may reference images not displayed]

ACR Breast Density Category b: There are scattered areas of
fibroglandular density.
FINDINGS: There are no findings suspicious for malignancy. Images were
processed with CAD.
IMPRESSION: No mammographic evidence of malignancy. A result letter of this
screening mammogram will be mailed directly to the patient.

RECOMMENDATION:
Screening mammogram in one year. (Code:[id number]-[id number])

BI-RADS CATEGORY  1: Negative.

## 2021-08-28 ENCOUNTER — Other Ambulatory Visit: Payer: Self-pay | Admitting: Family Medicine

## 2021-08-28 DIAGNOSIS — Z1231 Encounter for screening mammogram for malignant neoplasm of breast: Secondary | ICD-10-CM

## 2021-09-29 ENCOUNTER — Ambulatory Visit
Admission: RE | Admit: 2021-09-29 | Discharge: 2021-09-29 | Disposition: A | Discharge status: Home / Self Care | Payer: 59 | Source: Ambulatory Visit | Attending: Family Medicine | Admitting: Family Medicine

## 2021-09-29 DIAGNOSIS — Z1231 Encounter for screening mammogram for malignant neoplasm of breast: Secondary | ICD-10-CM

## 2022-12-03 ENCOUNTER — Other Ambulatory Visit: Payer: Self-pay | Admitting: Internal Medicine

## 2022-12-03 DIAGNOSIS — Z1231 Encounter for screening mammogram for malignant neoplasm of breast: Secondary | ICD-10-CM

## 2022-12-05 ENCOUNTER — Ambulatory Visit
Admission: RE | Admit: 2022-12-05 | Discharge: 2022-12-05 | Disposition: A | Discharge status: Home / Self Care | Payer: Medicaid Other | Source: Ambulatory Visit | Attending: Internal Medicine | Admitting: Internal Medicine

## 2022-12-05 DIAGNOSIS — Z1231 Encounter for screening mammogram for malignant neoplasm of breast: Secondary | ICD-10-CM

## 2023-07-05 ENCOUNTER — Encounter: Payer: Self-pay | Admitting: Internal Medicine

## 2023-07-05 ENCOUNTER — Other Ambulatory Visit (HOSPITAL_COMMUNITY): Payer: Self-pay | Admitting: Internal Medicine

## 2023-07-05 DIAGNOSIS — Z136 Encounter for screening for cardiovascular disorders: Secondary | ICD-10-CM

## 2023-07-12 ENCOUNTER — Ambulatory Visit (INDEPENDENT_AMBULATORY_CARE_PROVIDER_SITE_OTHER): Payer: Self-pay

## 2023-07-12 DIAGNOSIS — Z136 Encounter for screening for cardiovascular disorders: Secondary | ICD-10-CM

## 2024-06-05 ENCOUNTER — Ambulatory Visit
Admission: RE | Admit: 2024-06-05 | Discharge: 2024-06-05 | Disposition: A | Discharge status: Home / Self Care | Source: Ambulatory Visit | Attending: Internal Medicine | Admitting: Internal Medicine

## 2024-06-05 ENCOUNTER — Other Ambulatory Visit: Payer: Self-pay | Admitting: Internal Medicine

## 2024-06-05 DIAGNOSIS — M5412 Radiculopathy, cervical region: Secondary | ICD-10-CM

## 2024-06-15 ENCOUNTER — Other Ambulatory Visit: Payer: Self-pay | Admitting: Internal Medicine

## 2024-06-15 DIAGNOSIS — Z1231 Encounter for screening mammogram for malignant neoplasm of breast: Secondary | ICD-10-CM

## 2024-06-16 ENCOUNTER — Inpatient Hospital Stay
Admission: RE | Admit: 2024-06-16 | Discharge: 2024-06-16 | Discharge status: Home / Self Care | Source: Ambulatory Visit | Attending: Internal Medicine

## 2024-06-16 DIAGNOSIS — Z1231 Encounter for screening mammogram for malignant neoplasm of breast: Secondary | ICD-10-CM

## 2024-06-30 NOTE — Progress Notes (Deleted)
 Referring Physician:  Vernon Velna SAUNDERS, MD [address] [place],  [place] [address]  Primary Physician:  Wanda Charleston, MD (Inactive)  History of Present Illness: 06/30/2024*** [name] is healthy.***  Right side neck pain that radiates down right arm and intermittently to right leg causing numbness and tingling in both arm and leg.   Duration: *** Location: *** Quality: *** Severity: ***  Precipitating: aggravated by *** Modifying factors: made better by *** Weakness: none Timing: ***  Tobacco use: smokes *** PPD x years. Does not smoke.   Bowel/Bladder Dysfunction: none  Conservative measures:  Physical therapy: *** has not participated in  Multimodal medical therapy including regular antiinflammatories: *** Tylenol , Ibuprofen , Lidocaine  patches, Flexeril Injections: no epidural steroid injections  Past Surgery: ***none  Wanda Peterson has ***no symptoms of cervical myelopathy.  The symptoms are causing a significant impact on the patient's life.   Review of Systems:  A 10 point review of systems is negative, except for the pertinent positives and negatives detailed in the HPI.  Past Medical History: No past medical history on file.  Past Surgical History: Past Surgical History:  Procedure Laterality Date   NO PAST SURGERIES      Allergies: Allergies as of 07/02/2024   (No Known Allergies)    Medications: Outpatient Encounter Medications as of 07/02/2024  Medication Sig   ondansetron  (ZOFRAN  ODT) 8 MG disintegrating tablet Take 1 tablet (8 mg total) by mouth every 8 (eight) hours as needed for nausea or vomiting.   No facility-administered encounter medications on file as of 07/02/2024.    Social History: Social History   Tobacco Use   Smoking status: Never   Smokeless tobacco: Never  Substance Use Topics   Alcohol use: No   Drug use: No    Family Medical History: Family History  Problem Relation Age of Onset    Hypertension Mother    Heart disease Father    Diabetes Father    Heart Problems Father        Pacemaker   Down syndrome Other        2 nieces   Thalassemia Other    Breast cancer Neg Hx     Physical Examination: There were no vitals filed for this visit.  General: Patient is well developed, well nourished, calm, collected, and in no apparent distress. Attention to examination is appropriate.  Respiratory: Patient is breathing without any difficulty.   NEUROLOGICAL:     Awake, alert, oriented to person, place, and time.  Speech is clear and fluent. Fund of knowledge is appropriate.   Cranial Nerves: Pupils equal round and reactive to light.  Facial tone is symmetric.    *** ROM of cervical spine *** pain *** posterior cervical tenderness. *** tenderness in bilateral trapezial region.   *** ROM of lumbar spine *** pain *** posterior lumbar tenderness.   No abnormal lesions on exposed skin.   Strength: Side Biceps Triceps Deltoid Interossei Grip Wrist Ext. Wrist Flex.  R 5 5 5 5 5 5 5   L 5 5 5 5 5 5 5    Side Iliopsoas Quads Hamstring PF DF EHL  R 5 5 5 5 5 5   L 5 5 5 5 5 5    Reflexes are ***2+ and symmetric at the biceps, brachioradialis, patella and achilles.   Hoffman's is absent.  Clonus is not present.   Bilateral upper and lower extremity sensation is intact to light touch.  Gait is normal.   ***No difficulty with tandem gait.    Medical Decision Making  Imaging: Cervical CT scan dated 06/05/24:  FINDINGS: Alignment: Stable cervical lordosis, mild straightening. Cervicothoracic junction alignment is within normal limits. Bilateral posterior element alignment is within normal limits. Bone mineralization is within normal limits.   Skull base and vertebrae: Visualized skull base is intact. No atlanto-occipital dissociation. C1 and C2 appear intact and aligned. Bone mineralization is within normal limits. No acute osseous abnormality identified.   Soft  tissues and spinal canal: No prevertebral fluid or swelling. No visible canal hematoma. Negative visible noncontrast neck soft tissues.   Disc levels:   C2-C3:  Negative.   C3-C4: Foraminal disc osteophyte complex more pronounced on the left. No spinal stenosis. Moderate to severe left C4 neural foraminal stenosis (series 4, image 52). Mild right foraminal stenosis.   C4-C5:  Minimal endplate and facet spurring.  No stenosis.   C5-C6:  Minimal disc bulging.  Mild facet hypertrophy.  No stenosis.   C6-C7: Mild disc bulging. Mild facet and endplate spurring. No convincing spinal or foraminal stenosis (series 4, image 74).   C7-T1: Mild facet hypertrophy. Mild mostly anterior disc bulging and endplate spurring. No convincing stenosis.   Upper chest: Negative visible noncontrast thoracic inlet.   Other: Cervicomedullary junction is within normal limits. Negative visible noncontrast brain parenchyma. Visualized paranasal sinuses, tympanic cavities, and mastoids are clear.   IMPRESSION: 1. No acute osseous abnormality and generally mild for age degenerative changes in the cervical spine. 2. Leftward disc and endplate degeneration at C3-C4 results in moderate to severe left neural foraminal stenosis. Query Left C4 radiculitis. By CT only mild right cervical foraminal stenosis, and limited to that level.     Electronically Signed   By: Wanda Peterson M.D.   On: 06/05/2024 12:33  I have personally reviewed the images and agree with the above interpretation.  Assessment and Plan: [name] is a pleasant 43 y.o. female has ***  Treatment options discussed with patient and following plan made:   - Order for physical therapy for *** spine ***. Patient to call to schedule appointment. *** - Continue current medications including ***. Reviewed dosing and side effects.  - Prescription for ***. Reviewed dosing and side effects. Take with food.  - Prescription for *** to take prn muscle  spasms. Reviewed dosing and side effects. Discussed this can cause drowsiness.  - MRI of *** to further evaluate *** radiculopathy. No improvement time or medications (***).  - Referral to [facility] at [facility] to discuss possible *** injections.  - Will schedule phone visit to review MRI results once I get them back.   I spent a total of *** minutes in face-to-face and non-face-to-face activities related to this patient's care today including review of outside records, review of imaging, review of symptoms, physical exam, discussion of differential diagnosis, discussion of treatment options, and documentation.   Thank you for involving me in the care of this patient.   Glade Boys PA-C Dept. of Neurosurgery

## 2024-07-02 ENCOUNTER — Ambulatory Visit: Admitting: Orthopedic Surgery

## 2024-07-24 NOTE — Progress Notes (Signed)
 Referring Physician:  Vernon Velna SAUNDERS, MD [address] [place],  [place] [address]  Primary Physician:  Hugh Charleston, MD (Inactive)  History of Present Illness: 07/27/2024 [name] has high cholesterol.   She has 2 month history of constant right sided neck pain that radiates into shoulder and down to her hand to her small/ring finger. She notes intermittent headaches with intermittent static/ringing noise in her right ear (not associated with headaches). Sensations in her ear started in May. She has seen ENT. She has weakness and tingling in right hand. Pain is worse with chores at home. Some relief with heat.   Tobacco use: Does not smoke.   Bowel/Bladder Dysfunction: none  Conservative measures:  Physical therapy:  did PT for right shoulder in the spring Multimodal medical therapy including regular antiinflammatories:  Tylenol , Ibuprofen , Lidocaine  patches, Flexeril Injections: no epidural steroid injections  Past Surgery: no spine surgery  Wanda Peterson has no symptoms of cervical myelopathy.  The symptoms are causing a significant impact on the patient's life.   Review of Systems:  A 10 point review of systems is negative, except for the pertinent positives and negatives detailed in the HPI.  Past Medical History: History reviewed. No pertinent past medical history.  Past Surgical History: Past Surgical History:  Procedure Laterality Date   NO PAST SURGERIES      Allergies: Allergies as of 07/27/2024   (No Known Allergies)    Medications: Outpatient Encounter Medications as of 07/27/2024  Medication Sig   drospirenone-ethinyl estradiol (YASMIN) 3-0.03 MG tablet Take 1 tablet by mouth daily.   ibuprofen  (ADVIL ) 200 MG tablet Take 200 mg by mouth every 6 (six) hours as needed.   [DISCONTINUED] cyclobenzaprine (FLEXERIL) 10 MG tablet Take 10 mg by mouth at bedtime as needed.   [DISCONTINUED] diclofenac (VOLTAREN) 75 MG EC  tablet Take 75 mg by mouth 2 (two) times daily.   [DISCONTINUED] ondansetron  (ZOFRAN  ODT) 8 MG disintegrating tablet Take 1 tablet (8 mg total) by mouth every 8 (eight) hours as needed for nausea or vomiting.   No facility-administered encounter medications on file as of 07/27/2024.    Social History: Social History   Tobacco Use   Smoking status: Never   Smokeless tobacco: Never  Substance Use Topics   Alcohol use: No   Drug use: No    Family Medical History: Family History  Problem Relation Age of Onset   Hypertension Mother    Heart disease Father    Diabetes Father    Heart Problems Father        Pacemaker   Down syndrome Other        2 nieces   Thalassemia Other    Breast cancer Neg Hx     Physical Examination: Vitals:   07/27/24 0948  BP: 116/70    General: Patient is well developed, well nourished, calm, collected, and in no apparent distress. Attention to examination is appropriate.  Respiratory: Patient is breathing without any difficulty.   NEUROLOGICAL:     Awake, alert, oriented to person, place, and time.  Speech is clear and fluent. Fund of knowledge is appropriate.   Cranial Nerves: Pupils equal round and reactive to light.  Facial tone is symmetric.    No posterior cervical tenderness. Mild tenderness in right trapezial region. She has tenderness into right scapular region as well.   Good ROM of both shoulders with no significant pain.   No abnormal lesions on exposed skin.  Strength: Side Biceps Triceps Deltoid Interossei Grip Wrist Ext. Wrist Flex.  R 5 5 5 5 5 5 5   L 5 5 5 5 5 5 5    Side Iliopsoas Quads Hamstring PF DF EHL  R 5 5 5 5 5 5   L 5 5 5 5 5 5    Reflexes are 2+ and symmetric at the biceps, brachioradialis, patella and achilles.   Hoffman's is absent.  Clonus is not present.   Bilateral upper and lower extremity sensation is intact to light touch.     No pain with IR/ER of both hips.   Gait is normal.     Medical  Decision Making  Imaging: Cervical CT scan dated 06/05/24:  FINDINGS: Alignment: Stable cervical lordosis, mild straightening. Cervicothoracic junction alignment is within normal limits. Bilateral posterior element alignment is within normal limits. Bone mineralization is within normal limits.   Skull base and vertebrae: Visualized skull base is intact. No atlanto-occipital dissociation. C1 and C2 appear intact and aligned. Bone mineralization is within normal limits. No acute osseous abnormality identified.   Soft tissues and spinal canal: No prevertebral fluid or swelling. No visible canal hematoma. Negative visible noncontrast neck soft tissues.   Disc levels:   C2-C3:  Negative.   C3-C4: Foraminal disc osteophyte complex more pronounced on the left. No spinal stenosis. Moderate to severe left C4 neural foraminal stenosis (series 4, image 52). Mild right foraminal stenosis.   C4-C5:  Minimal endplate and facet spurring.  No stenosis.   C5-C6:  Minimal disc bulging.  Mild facet hypertrophy.  No stenosis.   C6-C7: Mild disc bulging. Mild facet and endplate spurring. No convincing spinal or foraminal stenosis (series 4, image 74).   C7-T1: Mild facet hypertrophy. Mild mostly anterior disc bulging and endplate spurring. No convincing stenosis.   Upper chest: Negative visible noncontrast thoracic inlet.   Other: Cervicomedullary junction is within normal limits. Negative visible noncontrast brain parenchyma. Visualized paranasal sinuses, tympanic cavities, and mastoids are clear.   IMPRESSION: 1. No acute osseous abnormality and generally mild for age degenerative changes in the cervical spine. 2. Leftward disc and endplate degeneration at C3-C4 results in moderate to severe left neural foraminal stenosis. Query Left C4 radiculitis. By CT only mild right cervical foraminal stenosis, and limited to that level.     Electronically Signed   By: VEAR Hurst M.D.   On:  06/05/2024 12:33  I have personally reviewed the images and agree with the above interpretation.  Assessment and Plan: Ms. Worthy has a 2 month history of constant right sided neck pain that radiates into shoulder and down to her hand to her small/ring finger. She notes intermittent headaches as well. She has tingling and weakness in right hand.   CT shows cervical spondylosis with moderate/severe left foraminal stenosis and mild right foraminal stenosis at C3-C4. This would not fully explain her right arm pain. She has tenderness in trapezial region into her scapular region on right- a lot of her pain may be myofascial in nature.   Also with 6 month history of intermittent static/ringing noise in her right ear (not associated with headaches). She has seen ENT and PCP for this.   Treatment options discussed with patient and following plan made:   - Get cervical MRI results from 07/09/24- done at [facility]. Images have not been read yet. She will let me know when she gets the report so we can get images/report.  - Discussed ringing/static in right ear with Dr. Claudene.  Since she has already seen ENT, recommend MRI of brain with and without contrast for further evaluation. She wants to think about this.  - Will message her on MyChart regarding cervical MRI results and we can revisit MRI of brain.  - Discussed that depending on the results, we may consider PT and/or injections.   I spent a total of 35 minutes in face-to-face and non-face-to-face activities related to this patient's care today including review of outside records, review of imaging, review of symptoms, physical exam, discussion of differential diagnosis, discussion of treatment options, and documentation.   Thank you for involving me in the care of this patient.   Glade Boys PA-C Dept. of Neurosurgery

## 2024-07-27 ENCOUNTER — Ambulatory Visit: Admitting: Orthopedic Surgery

## 2024-07-27 ENCOUNTER — Encounter: Payer: Self-pay | Admitting: Orthopedic Surgery

## 2024-07-27 VITALS — BP 116/70 | Ht 66.0 in | Wt 197.0 lb

## 2024-07-27 DIAGNOSIS — M5412 Radiculopathy, cervical region: Secondary | ICD-10-CM

## 2024-07-27 DIAGNOSIS — M79601 Pain in right arm: Secondary | ICD-10-CM

## 2024-07-27 DIAGNOSIS — M47812 Spondylosis without myelopathy or radiculopathy, cervical region: Secondary | ICD-10-CM | POA: Diagnosis not present

## 2024-07-27 DIAGNOSIS — M9971 Connective tissue and disc stenosis of intervertebral foramina of cervical region: Secondary | ICD-10-CM | POA: Diagnosis not present

## 2024-07-27 DIAGNOSIS — H9311 Tinnitus, right ear: Secondary | ICD-10-CM

## 2024-07-27 NOTE — Patient Instructions (Addendum)
 It was so nice to see you today. Thank you so much for coming in.    CT scan of your neck showed some wear and tear with some mild irritation of the nerve on the right at C4-C5. This may explain some of your neck and shoulder pain, but would not explain the arm pain.   I want to review the MRI of your neck you had done earlier this month. Please let me know when the results are back so we can get the images and report.   As for the ringing/noise in your right ear, we recommend getting an MRI of your brain with and without contrast. Since you have already seen ENT, we would get this to look into other causes from your brain.   Once I have your neck MRI results/images, I will message you in MyChart to regroup. We can also discuss the brain MRI again.   Please do not hesitate to call if you have any questions or concerns. You can also message me in MyChart.   Glade Boys PA-C [phone]     The physicians and staff at [facility] are committed to providing excellent care. You may receive a survey asking for feedback about your experience at our office. We value you your feedback and appreciate you taking the time to to fill it out. The [facility] leadership team is also available to discuss your experience in person, feel free to contact us  [phone].

## 2024-07-30 ENCOUNTER — Other Ambulatory Visit: Payer: Self-pay | Admitting: Family Medicine

## 2024-07-30 ENCOUNTER — Inpatient Hospital Stay
Admission: RE | Admit: 2024-07-30 | Discharge: 2024-07-30 | Disposition: A | Payer: Self-pay | Source: Ambulatory Visit | Attending: Orthopedic Surgery | Admitting: Orthopedic Surgery

## 2024-07-30 ENCOUNTER — Encounter: Payer: Self-pay | Admitting: Orthopedic Surgery

## 2024-07-30 DIAGNOSIS — Z049 Encounter for examination and observation for unspecified reason: Secondary | ICD-10-CM

## 2024-07-30 NOTE — Telephone Encounter (Signed)
 Cervical MRI dated 07/09/24:  FINDINGS: Alignment: No substantial subluxation.  Vertebrae: Vertebral body heights are maintained. No marrow signal abnormalities to suggest neoplasm.  Spinal cord: Normal signal.  Craniocervical junction: No focal abnormality.  Degenerative changes: C1-C2: No substantial canal or foraminal stenosis. C2-C3: No substantial canal or foraminal stenosis. C3-C4: No substantial canal or foraminal stenosis. C4-C5: No substantial canal or foraminal stenosis. C5-C6: No substantial canal or foraminal stenosis. C6-C7: No substantial canal or foraminal stenosis. C7-T1: No substantial canal or foraminal stenosis.  Visualized portion of the thoracic spine: No high grade canal stenosis.  IMPRESSION: No high grade canal or foraminal stenosis within the cervical spine.  No cervical cord signal abnormality. Exam End: 07/09/24 15:10   Specimen Collected: 07/28/24 10:38 Last Resulted: 07/28/24 10:39  Received From: [facility]  Result Received: 07/29/24 08:26   I have personally reviewed the images and agree with the above interpretation.  I do think that she has mild/moderate left foraminal stenosis C3-C4.

## 2024-09-10 DIAGNOSIS — H9311 Tinnitus, right ear: Secondary | ICD-10-CM

## 2024-09-11 NOTE — Addendum Note (Signed)
 Addended byBETHA HILMA HASTINGS on: 09/11/2024 01:15 PM   Modules accepted: Orders
